# Patient Record
Sex: Male | Born: 1940 | Race: Black or African American | Marital: Married | Smoking: Former smoker
Health system: Southern US, Community
[De-identification: ages and names within clinical notes are randomized; demographics above are authoritative.]

## PROBLEM LIST (undated history)

## (undated) DIAGNOSIS — K219 Gastro-esophageal reflux disease without esophagitis: Secondary | ICD-10-CM

## (undated) DIAGNOSIS — C801 Malignant (primary) neoplasm, unspecified: Secondary | ICD-10-CM

## (undated) DIAGNOSIS — H905 Unspecified sensorineural hearing loss: Secondary | ICD-10-CM

## (undated) DIAGNOSIS — I10 Essential (primary) hypertension: Secondary | ICD-10-CM

## (undated) DIAGNOSIS — G4733 Obstructive sleep apnea (adult) (pediatric): Secondary | ICD-10-CM

## (undated) DIAGNOSIS — T8182XA Emphysema (subcutaneous) resulting from a procedure, initial encounter: Secondary | ICD-10-CM

## (undated) DIAGNOSIS — R739 Hyperglycemia, unspecified: Secondary | ICD-10-CM

## (undated) DIAGNOSIS — Z9981 Dependence on supplemental oxygen: Secondary | ICD-10-CM

## (undated) DIAGNOSIS — E785 Hyperlipidemia, unspecified: Secondary | ICD-10-CM

## (undated) DIAGNOSIS — R918 Other nonspecific abnormal finding of lung field: Secondary | ICD-10-CM

## (undated) DIAGNOSIS — J9611 Chronic respiratory failure with hypoxia: Secondary | ICD-10-CM

## (undated) DIAGNOSIS — J449 Chronic obstructive pulmonary disease, unspecified: Secondary | ICD-10-CM

## (undated) DIAGNOSIS — N289 Disorder of kidney and ureter, unspecified: Secondary | ICD-10-CM

---

## 2010-07-08 ENCOUNTER — Ambulatory Visit (HOSPITAL_COMMUNITY)
Admission: RE | Admit: 2010-07-08 | Discharge: 2010-07-08 | Disposition: A | Discharge status: Home / Self Care | Payer: Medicare Other | Source: Ambulatory Visit | Attending: Internal Medicine | Admitting: Internal Medicine

## 2010-07-08 ENCOUNTER — Other Ambulatory Visit (HOSPITAL_BASED_OUTPATIENT_CLINIC_OR_DEPARTMENT_OTHER): Payer: Self-pay | Admitting: Internal Medicine

## 2010-07-08 ENCOUNTER — Encounter (HOSPITAL_BASED_OUTPATIENT_CLINIC_OR_DEPARTMENT_OTHER): Payer: Medicare Other | Attending: Internal Medicine

## 2010-07-08 DIAGNOSIS — T148XXA Other injury of unspecified body region, initial encounter: Secondary | ICD-10-CM | POA: Insufficient documentation

## 2010-07-08 DIAGNOSIS — X58XXXA Exposure to other specified factors, initial encounter: Secondary | ICD-10-CM | POA: Insufficient documentation

## 2010-07-08 DIAGNOSIS — E7800 Pure hypercholesterolemia, unspecified: Secondary | ICD-10-CM | POA: Insufficient documentation

## 2010-07-08 DIAGNOSIS — R918 Other nonspecific abnormal finding of lung field: Secondary | ICD-10-CM | POA: Insufficient documentation

## 2010-07-08 DIAGNOSIS — F17200 Nicotine dependence, unspecified, uncomplicated: Secondary | ICD-10-CM | POA: Insufficient documentation

## 2010-07-08 DIAGNOSIS — Z96649 Presence of unspecified artificial hip joint: Secondary | ICD-10-CM | POA: Insufficient documentation

## 2010-07-08 DIAGNOSIS — Z8589 Personal history of malignant neoplasm of other organs and systems: Secondary | ICD-10-CM | POA: Insufficient documentation

## 2010-07-08 DIAGNOSIS — H409 Unspecified glaucoma: Secondary | ICD-10-CM | POA: Insufficient documentation

## 2010-07-08 DIAGNOSIS — Z79899 Other long term (current) drug therapy: Secondary | ICD-10-CM | POA: Insufficient documentation

## 2010-07-08 DIAGNOSIS — Y842 Radiological procedure and radiotherapy as the cause of abnormal reaction of the patient, or of later complication, without mention of misadventure at the time of the procedure: Secondary | ICD-10-CM | POA: Insufficient documentation

## 2010-07-08 DIAGNOSIS — I10 Essential (primary) hypertension: Secondary | ICD-10-CM | POA: Insufficient documentation

## 2010-07-08 DIAGNOSIS — M278 Other specified diseases of jaws: Secondary | ICD-10-CM | POA: Insufficient documentation

## 2010-07-09 ENCOUNTER — Other Ambulatory Visit (HOSPITAL_BASED_OUTPATIENT_CLINIC_OR_DEPARTMENT_OTHER): Payer: Self-pay | Admitting: General Surgery

## 2010-07-09 DIAGNOSIS — R911 Solitary pulmonary nodule: Secondary | ICD-10-CM

## 2010-07-12 ENCOUNTER — Encounter (HOSPITAL_COMMUNITY): Payer: Self-pay

## 2010-07-12 ENCOUNTER — Ambulatory Visit (HOSPITAL_COMMUNITY)
Admission: RE | Admit: 2010-07-12 | Discharge: 2010-07-12 | Disposition: A | Discharge status: Home / Self Care | Payer: Medicare Other | Source: Ambulatory Visit | Attending: General Surgery | Admitting: General Surgery

## 2010-07-12 DIAGNOSIS — R911 Solitary pulmonary nodule: Secondary | ICD-10-CM

## 2010-07-12 DIAGNOSIS — M4000 Postural kyphosis, site unspecified: Secondary | ICD-10-CM | POA: Insufficient documentation

## 2010-07-12 DIAGNOSIS — Z96649 Presence of unspecified artificial hip joint: Secondary | ICD-10-CM | POA: Insufficient documentation

## 2010-07-12 DIAGNOSIS — I700 Atherosclerosis of aorta: Secondary | ICD-10-CM | POA: Insufficient documentation

## 2010-07-12 DIAGNOSIS — K7689 Other specified diseases of liver: Secondary | ICD-10-CM | POA: Insufficient documentation

## 2010-07-12 DIAGNOSIS — K449 Diaphragmatic hernia without obstruction or gangrene: Secondary | ICD-10-CM | POA: Insufficient documentation

## 2010-07-12 DIAGNOSIS — I2584 Coronary atherosclerosis due to calcified coronary lesion: Secondary | ICD-10-CM | POA: Insufficient documentation

## 2010-07-12 DIAGNOSIS — R918 Other nonspecific abnormal finding of lung field: Secondary | ICD-10-CM | POA: Insufficient documentation

## 2010-07-12 MED ORDER — IOHEXOL 300 MG/ML  SOLN
100.0000 mL | Freq: Once | INTRAMUSCULAR | Status: AC | PRN
  Administered 2010-07-12: 100 mL via INTRAVENOUS

## 2010-07-15 ENCOUNTER — Encounter (HOSPITAL_BASED_OUTPATIENT_CLINIC_OR_DEPARTMENT_OTHER): Payer: Medicare Other | Attending: Internal Medicine

## 2010-07-15 DIAGNOSIS — I10 Essential (primary) hypertension: Secondary | ICD-10-CM | POA: Insufficient documentation

## 2010-07-15 DIAGNOSIS — Z79899 Other long term (current) drug therapy: Secondary | ICD-10-CM | POA: Insufficient documentation

## 2010-07-15 DIAGNOSIS — E7800 Pure hypercholesterolemia, unspecified: Secondary | ICD-10-CM | POA: Insufficient documentation

## 2010-07-15 DIAGNOSIS — Z85819 Personal history of malignant neoplasm of unspecified site of lip, oral cavity, and pharynx: Secondary | ICD-10-CM | POA: Insufficient documentation

## 2010-07-15 DIAGNOSIS — Z7982 Long term (current) use of aspirin: Secondary | ICD-10-CM | POA: Insufficient documentation

## 2010-07-15 DIAGNOSIS — K056 Periodontal disease, unspecified: Secondary | ICD-10-CM | POA: Insufficient documentation

## 2010-07-15 DIAGNOSIS — M278 Other specified diseases of jaws: Secondary | ICD-10-CM | POA: Insufficient documentation

## 2010-07-15 DIAGNOSIS — Y842 Radiological procedure and radiotherapy as the cause of abnormal reaction of the patient, or of later complication, without mention of misadventure at the time of the procedure: Secondary | ICD-10-CM | POA: Insufficient documentation

## 2010-07-15 DIAGNOSIS — H409 Unspecified glaucoma: Secondary | ICD-10-CM | POA: Insufficient documentation

## 2010-07-19 ENCOUNTER — Encounter (HOSPITAL_BASED_OUTPATIENT_CLINIC_OR_DEPARTMENT_OTHER): Payer: Medicare Other | Attending: Internal Medicine

## 2010-07-19 DIAGNOSIS — F17200 Nicotine dependence, unspecified, uncomplicated: Secondary | ICD-10-CM | POA: Insufficient documentation

## 2010-07-19 DIAGNOSIS — I10 Essential (primary) hypertension: Secondary | ICD-10-CM | POA: Insufficient documentation

## 2010-07-19 DIAGNOSIS — E7800 Pure hypercholesterolemia, unspecified: Secondary | ICD-10-CM | POA: Insufficient documentation

## 2010-07-19 DIAGNOSIS — Y842 Radiological procedure and radiotherapy as the cause of abnormal reaction of the patient, or of later complication, without mention of misadventure at the time of the procedure: Secondary | ICD-10-CM | POA: Insufficient documentation

## 2010-07-19 DIAGNOSIS — H409 Unspecified glaucoma: Secondary | ICD-10-CM | POA: Insufficient documentation

## 2010-07-19 DIAGNOSIS — M278 Other specified diseases of jaws: Secondary | ICD-10-CM | POA: Insufficient documentation

## 2010-07-19 DIAGNOSIS — Z79899 Other long term (current) drug therapy: Secondary | ICD-10-CM | POA: Insufficient documentation

## 2010-07-19 DIAGNOSIS — Z96649 Presence of unspecified artificial hip joint: Secondary | ICD-10-CM | POA: Insufficient documentation

## 2010-07-19 DIAGNOSIS — Z8589 Personal history of malignant neoplasm of other organs and systems: Secondary | ICD-10-CM | POA: Insufficient documentation

## 2010-07-21 ENCOUNTER — Other Ambulatory Visit (HOSPITAL_BASED_OUTPATIENT_CLINIC_OR_DEPARTMENT_OTHER): Payer: Self-pay | Admitting: Internal Medicine

## 2010-08-17 ENCOUNTER — Encounter (HOSPITAL_BASED_OUTPATIENT_CLINIC_OR_DEPARTMENT_OTHER): Payer: Medicare Other | Attending: Internal Medicine

## 2010-08-17 DIAGNOSIS — C080 Malignant neoplasm of submandibular gland: Secondary | ICD-10-CM | POA: Insufficient documentation

## 2010-08-17 DIAGNOSIS — Z923 Personal history of irradiation: Secondary | ICD-10-CM | POA: Insufficient documentation

## 2010-08-17 DIAGNOSIS — Z79899 Other long term (current) drug therapy: Secondary | ICD-10-CM | POA: Insufficient documentation

## 2010-08-17 DIAGNOSIS — I10 Essential (primary) hypertension: Secondary | ICD-10-CM | POA: Insufficient documentation

## 2010-08-17 DIAGNOSIS — Z96649 Presence of unspecified artificial hip joint: Secondary | ICD-10-CM | POA: Insufficient documentation

## 2010-08-17 DIAGNOSIS — Z7982 Long term (current) use of aspirin: Secondary | ICD-10-CM | POA: Insufficient documentation

## 2010-08-17 DIAGNOSIS — I998 Other disorder of circulatory system: Secondary | ICD-10-CM | POA: Insufficient documentation

## 2010-08-17 DIAGNOSIS — F17200 Nicotine dependence, unspecified, uncomplicated: Secondary | ICD-10-CM | POA: Insufficient documentation

## 2010-08-17 DIAGNOSIS — E7800 Pure hypercholesterolemia, unspecified: Secondary | ICD-10-CM | POA: Insufficient documentation

## 2010-08-17 DIAGNOSIS — K055 Other periodontal diseases: Secondary | ICD-10-CM | POA: Insufficient documentation

## 2010-08-17 DIAGNOSIS — M278 Other specified diseases of jaws: Secondary | ICD-10-CM | POA: Insufficient documentation

## 2010-08-18 ENCOUNTER — Other Ambulatory Visit (HOSPITAL_BASED_OUTPATIENT_CLINIC_OR_DEPARTMENT_OTHER): Payer: Self-pay | Admitting: Internal Medicine

## 2010-08-18 LAB — GLUCOSE, CAPILLARY: Glucose-Capillary: 118 mg/dL — ABNORMAL HIGH (ref 70–99)

## 2010-09-17 ENCOUNTER — Encounter (HOSPITAL_BASED_OUTPATIENT_CLINIC_OR_DEPARTMENT_OTHER): Payer: Medicare Other | Attending: Internal Medicine

## 2010-09-17 DIAGNOSIS — C080 Malignant neoplasm of submandibular gland: Secondary | ICD-10-CM | POA: Insufficient documentation

## 2010-09-17 DIAGNOSIS — F17200 Nicotine dependence, unspecified, uncomplicated: Secondary | ICD-10-CM | POA: Insufficient documentation

## 2010-09-17 DIAGNOSIS — Z7982 Long term (current) use of aspirin: Secondary | ICD-10-CM | POA: Insufficient documentation

## 2010-09-17 DIAGNOSIS — Z96649 Presence of unspecified artificial hip joint: Secondary | ICD-10-CM | POA: Insufficient documentation

## 2010-09-17 DIAGNOSIS — I10 Essential (primary) hypertension: Secondary | ICD-10-CM | POA: Insufficient documentation

## 2010-09-17 DIAGNOSIS — Z923 Personal history of irradiation: Secondary | ICD-10-CM | POA: Insufficient documentation

## 2010-09-17 DIAGNOSIS — Z79899 Other long term (current) drug therapy: Secondary | ICD-10-CM | POA: Insufficient documentation

## 2010-09-17 DIAGNOSIS — K055 Other periodontal diseases: Secondary | ICD-10-CM | POA: Insufficient documentation

## 2010-09-17 DIAGNOSIS — E7800 Pure hypercholesterolemia, unspecified: Secondary | ICD-10-CM | POA: Insufficient documentation

## 2010-09-17 DIAGNOSIS — I998 Other disorder of circulatory system: Secondary | ICD-10-CM | POA: Insufficient documentation

## 2010-09-17 DIAGNOSIS — M278 Other specified diseases of jaws: Secondary | ICD-10-CM | POA: Insufficient documentation

## 2019-03-21 ENCOUNTER — Emergency Department (HOSPITAL_COMMUNITY): Payer: Medicare HMO

## 2019-03-21 ENCOUNTER — Inpatient Hospital Stay (HOSPITAL_COMMUNITY)
Admission: EM | Admit: 2019-03-21 | Discharge: 2019-03-29 | DRG: 040 | Disposition: A | Discharge status: Skilled Nursing Facility | Payer: Medicare HMO | Attending: Internal Medicine | Admitting: Internal Medicine

## 2019-03-21 ENCOUNTER — Inpatient Hospital Stay (HOSPITAL_COMMUNITY): Payer: Medicare HMO

## 2019-03-21 ENCOUNTER — Encounter (HOSPITAL_COMMUNITY): Payer: Self-pay | Admitting: Neurology

## 2019-03-21 ENCOUNTER — Other Ambulatory Visit: Payer: Self-pay

## 2019-03-21 DIAGNOSIS — W19XXXA Unspecified fall, initial encounter: Secondary | ICD-10-CM | POA: Diagnosis present

## 2019-03-21 DIAGNOSIS — Z85830 Personal history of malignant neoplasm of bone: Secondary | ICD-10-CM

## 2019-03-21 DIAGNOSIS — R1310 Dysphagia, unspecified: Secondary | ICD-10-CM | POA: Diagnosis present

## 2019-03-21 DIAGNOSIS — Z9981 Dependence on supplemental oxygen: Secondary | ICD-10-CM

## 2019-03-21 DIAGNOSIS — K219 Gastro-esophageal reflux disease without esophagitis: Secondary | ICD-10-CM | POA: Diagnosis present

## 2019-03-21 DIAGNOSIS — E875 Hyperkalemia: Secondary | ICD-10-CM | POA: Diagnosis present

## 2019-03-21 DIAGNOSIS — R296 Repeated falls: Secondary | ICD-10-CM | POA: Diagnosis present

## 2019-03-21 DIAGNOSIS — E1151 Type 2 diabetes mellitus with diabetic peripheral angiopathy without gangrene: Secondary | ICD-10-CM | POA: Diagnosis present

## 2019-03-21 DIAGNOSIS — J9622 Acute and chronic respiratory failure with hypercapnia: Secondary | ICD-10-CM | POA: Diagnosis present

## 2019-03-21 DIAGNOSIS — G4733 Obstructive sleep apnea (adult) (pediatric): Secondary | ICD-10-CM | POA: Diagnosis present

## 2019-03-21 DIAGNOSIS — H905 Unspecified sensorineural hearing loss: Secondary | ICD-10-CM | POA: Diagnosis present

## 2019-03-21 DIAGNOSIS — G9341 Metabolic encephalopathy: Secondary | ICD-10-CM | POA: Diagnosis not present

## 2019-03-21 DIAGNOSIS — I639 Cerebral infarction, unspecified: Secondary | ICD-10-CM | POA: Diagnosis not present

## 2019-03-21 DIAGNOSIS — I361 Nonrheumatic tricuspid (valve) insufficiency: Secondary | ICD-10-CM | POA: Diagnosis not present

## 2019-03-21 DIAGNOSIS — J9621 Acute and chronic respiratory failure with hypoxia: Secondary | ICD-10-CM | POA: Diagnosis present

## 2019-03-21 DIAGNOSIS — Z9119 Patient's noncompliance with other medical treatment and regimen: Secondary | ICD-10-CM

## 2019-03-21 DIAGNOSIS — J449 Chronic obstructive pulmonary disease, unspecified: Secondary | ICD-10-CM | POA: Diagnosis present

## 2019-03-21 DIAGNOSIS — G9340 Encephalopathy, unspecified: Secondary | ICD-10-CM

## 2019-03-21 DIAGNOSIS — R29718 NIHSS score 18: Secondary | ICD-10-CM | POA: Diagnosis present

## 2019-03-21 DIAGNOSIS — R6510 Systemic inflammatory response syndrome (SIRS) of non-infectious origin without acute organ dysfunction: Secondary | ICD-10-CM | POA: Diagnosis present

## 2019-03-21 DIAGNOSIS — E872 Acidosis: Secondary | ICD-10-CM | POA: Diagnosis present

## 2019-03-21 DIAGNOSIS — Z7982 Long term (current) use of aspirin: Secondary | ICD-10-CM | POA: Diagnosis not present

## 2019-03-21 DIAGNOSIS — I6340 Cerebral infarction due to embolism of unspecified cerebral artery: Secondary | ICD-10-CM | POA: Diagnosis present

## 2019-03-21 DIAGNOSIS — M109 Gout, unspecified: Secondary | ICD-10-CM | POA: Diagnosis present

## 2019-03-21 DIAGNOSIS — R7401 Elevation of levels of liver transaminase levels: Secondary | ICD-10-CM

## 2019-03-21 DIAGNOSIS — Z6830 Body mass index (BMI) 30.0-30.9, adult: Secondary | ICD-10-CM

## 2019-03-21 DIAGNOSIS — Z20828 Contact with and (suspected) exposure to other viral communicable diseases: Secondary | ICD-10-CM | POA: Diagnosis present

## 2019-03-21 DIAGNOSIS — G92 Toxic encephalopathy: Secondary | ICD-10-CM

## 2019-03-21 DIAGNOSIS — R4701 Aphasia: Secondary | ICD-10-CM | POA: Diagnosis present

## 2019-03-21 DIAGNOSIS — R531 Weakness: Secondary | ICD-10-CM

## 2019-03-21 DIAGNOSIS — E669 Obesity, unspecified: Secondary | ICD-10-CM | POA: Diagnosis present

## 2019-03-21 DIAGNOSIS — I10 Essential (primary) hypertension: Secondary | ICD-10-CM | POA: Diagnosis present

## 2019-03-21 DIAGNOSIS — E785 Hyperlipidemia, unspecified: Secondary | ICD-10-CM | POA: Diagnosis present

## 2019-03-21 DIAGNOSIS — Z79899 Other long term (current) drug therapy: Secondary | ICD-10-CM | POA: Diagnosis not present

## 2019-03-21 DIAGNOSIS — H4020X0 Unspecified primary angle-closure glaucoma, stage unspecified: Secondary | ICD-10-CM | POA: Diagnosis present

## 2019-03-21 DIAGNOSIS — Z87891 Personal history of nicotine dependence: Secondary | ICD-10-CM | POA: Diagnosis not present

## 2019-03-21 DIAGNOSIS — Z23 Encounter for immunization: Secondary | ICD-10-CM

## 2019-03-21 DIAGNOSIS — I340 Nonrheumatic mitral (valve) insufficiency: Secondary | ICD-10-CM | POA: Diagnosis not present

## 2019-03-21 DIAGNOSIS — Z923 Personal history of irradiation: Secondary | ICD-10-CM

## 2019-03-21 DIAGNOSIS — N179 Acute kidney failure, unspecified: Secondary | ICD-10-CM | POA: Diagnosis present

## 2019-03-21 HISTORY — DX: Gastro-esophageal reflux disease without esophagitis: K21.9

## 2019-03-21 HISTORY — DX: Dependence on supplemental oxygen: Z99.81

## 2019-03-21 HISTORY — DX: Obstructive sleep apnea (adult) (pediatric): G47.33

## 2019-03-21 HISTORY — DX: Dependence on supplemental oxygen: J96.11

## 2019-03-21 HISTORY — DX: Other nonspecific abnormal finding of lung field: R91.8

## 2019-03-21 HISTORY — DX: Disorder of kidney and ureter, unspecified: N28.9

## 2019-03-21 HISTORY — DX: Emphysema (subcutaneous) resulting from a procedure, initial encounter: T81.82XA

## 2019-03-21 HISTORY — DX: Hyperglycemia, unspecified: R73.9

## 2019-03-21 HISTORY — DX: Unspecified sensorineural hearing loss: H90.5

## 2019-03-21 HISTORY — DX: Essential (primary) hypertension: I10

## 2019-03-21 HISTORY — DX: Malignant (primary) neoplasm, unspecified: C80.1

## 2019-03-21 HISTORY — DX: Hyperlipidemia, unspecified: E78.5

## 2019-03-21 HISTORY — DX: Chronic obstructive pulmonary disease, unspecified: J44.9

## 2019-03-21 LAB — COMPREHENSIVE METABOLIC PANEL
ALT: 457 U/L — ABNORMAL HIGH (ref 0–44)
AST: 490 U/L — ABNORMAL HIGH (ref 15–41)
Albumin: 3.2 g/dL — ABNORMAL LOW (ref 3.5–5.0)
Alkaline Phosphatase: 92 U/L (ref 38–126)
Anion gap: 14 (ref 5–15)
BUN: 27 mg/dL — ABNORMAL HIGH (ref 8–23)
CO2: 31 mmol/L (ref 22–32)
Calcium: 8.9 mg/dL (ref 8.9–10.3)
Chloride: 94 mmol/L — ABNORMAL LOW (ref 98–111)
Creatinine, Ser: 2.4 mg/dL — ABNORMAL HIGH (ref 0.61–1.24)
GFR calc Af Amer: 29 mL/min — ABNORMAL LOW (ref >60)
GFR calc non Af Amer: 25 mL/min — ABNORMAL LOW (ref >60)
Glucose, Bld: 156 mg/dL — ABNORMAL HIGH (ref 70–99)
Potassium: 6.4 mmol/L (ref 3.5–5.1)
Sodium: 139 mmol/L (ref 135–145)
Total Bilirubin: 1.1 mg/dL (ref 0.3–1.2)
Total Protein: 8.2 g/dL — ABNORMAL HIGH (ref 6.5–8.1)

## 2019-03-21 LAB — URINALYSIS, ROUTINE W REFLEX MICROSCOPIC
Bacteria, UA: NONE SEEN
Bilirubin Urine: NEGATIVE
Glucose, UA: 50 mg/dL — AB
Ketones, ur: NEGATIVE mg/dL
Leukocytes,Ua: NEGATIVE
Nitrite: NEGATIVE
Protein, ur: 30 mg/dL — AB
Specific Gravity, Urine: >1.046 — ABNORMAL HIGH (ref 1.005–1.030)
pH: 5 (ref 5.0–8.0)

## 2019-03-21 LAB — CBC
HCT: 42.8 % (ref 39.0–52.0)
Hemoglobin: 12.7 g/dL — ABNORMAL LOW (ref 13.0–17.0)
MCH: 27.1 pg (ref 26.0–34.0)
MCHC: 29.7 g/dL — ABNORMAL LOW (ref 30.0–36.0)
MCV: 91.5 fL (ref 80.0–100.0)
Platelets: 219 10*3/uL (ref 150–400)
RBC: 4.68 MIL/uL (ref 4.22–5.81)
RDW: 15.7 % — ABNORMAL HIGH (ref 11.5–15.5)
WBC: 12.5 10*3/uL — ABNORMAL HIGH (ref 4.0–10.5)
nRBC: 0.3 % — ABNORMAL HIGH (ref 0.0–0.2)

## 2019-03-21 LAB — RAPID URINE DRUG SCREEN, HOSP PERFORMED
Amphetamines: NOT DETECTED
Barbiturates: NOT DETECTED
Benzodiazepines: NOT DETECTED
Cocaine: NOT DETECTED
Opiates: POSITIVE — AB
Tetrahydrocannabinol: NOT DETECTED

## 2019-03-21 LAB — POCT I-STAT 7, (LYTES, BLD GAS, ICA,H+H)
Acid-Base Excess: 7 mmol/L — ABNORMAL HIGH (ref 0.0–2.0)
Acid-Base Excess: 11 mmol/L — ABNORMAL HIGH (ref 0.0–2.0)
Bicarbonate: 36.1 mmol/L — ABNORMAL HIGH (ref 20.0–28.0)
Bicarbonate: 39.5 mmol/L — ABNORMAL HIGH (ref 20.0–28.0)
Calcium, Ion: 1.15 mmol/L (ref 1.15–1.40)
Calcium, Ion: 1.16 mmol/L (ref 1.15–1.40)
HCT: 37 % — ABNORMAL LOW (ref 39.0–52.0)
HCT: 38 % — ABNORMAL LOW (ref 39.0–52.0)
Hemoglobin: 12.6 g/dL — ABNORMAL LOW (ref 13.0–17.0)
Hemoglobin: 12.9 g/dL — ABNORMAL LOW (ref 13.0–17.0)
O2 Saturation: 88 %
O2 Saturation: 98 %
Patient temperature: 98
Patient temperature: 98
Potassium: 5.4 mmol/L — ABNORMAL HIGH (ref 3.5–5.1)
Potassium: 4.7 mmol/L (ref 3.5–5.1)
Sodium: 136 mmol/L (ref 135–145)
Sodium: 139 mmol/L (ref 135–145)
TCO2: 38 mmol/L — ABNORMAL HIGH (ref 22–32)
TCO2: 42 mmol/L — ABNORMAL HIGH (ref 22–32)
pCO2 arterial: 70.5 mmHg (ref 32.0–48.0)
pCO2 arterial: 70.9 mmHg (ref 32.0–48.0)
pH, Arterial: 7.315 — ABNORMAL LOW (ref 7.350–7.450)
pH, Arterial: 7.353 (ref 7.350–7.450)
pO2, Arterial: 61 mmHg — ABNORMAL LOW (ref 83.0–108.0)
pO2, Arterial: 110 mmHg — ABNORMAL HIGH (ref 83.0–108.0)

## 2019-03-21 LAB — CBG MONITORING, ED
Glucose-Capillary: 119 mg/dL — ABNORMAL HIGH (ref 70–99)
Glucose-Capillary: 154 mg/dL — ABNORMAL HIGH (ref 70–99)

## 2019-03-21 LAB — SALICYLATE LEVEL: Salicylate Lvl: <7 mg/dL (ref 2.8–30.0)

## 2019-03-21 LAB — I-STAT CHEM 8, ED
BUN: 38 mg/dL — ABNORMAL HIGH (ref 8–23)
Calcium, Ion: 0.97 mmol/L — ABNORMAL LOW (ref 1.15–1.40)
Chloride: 97 mmol/L — ABNORMAL LOW (ref 98–111)
Creatinine, Ser: 2.3 mg/dL — ABNORMAL HIGH (ref 0.61–1.24)
Glucose, Bld: 153 mg/dL — ABNORMAL HIGH (ref 70–99)
HCT: 43 % (ref 39.0–52.0)
Hemoglobin: 14.6 g/dL (ref 13.0–17.0)
Potassium: 6.4 mmol/L (ref 3.5–5.1)
Sodium: 136 mmol/L (ref 135–145)
TCO2: 35 mmol/L — ABNORMAL HIGH (ref 22–32)

## 2019-03-21 LAB — ACETAMINOPHEN LEVEL: Acetaminophen (Tylenol), Serum: <10 ug/mL — ABNORMAL LOW (ref 10–30)

## 2019-03-21 LAB — LACTIC ACID, PLASMA
Lactic Acid, Venous: 2.5 mmol/L (ref 0.5–1.9)
Lactic Acid, Venous: 1.5 mmol/L (ref 0.5–1.9)

## 2019-03-21 LAB — DIFFERENTIAL
Abs Immature Granulocytes: 0.15 10*3/uL — ABNORMAL HIGH (ref 0.00–0.07)
Basophils Absolute: 0 10*3/uL (ref 0.0–0.1)
Basophils Relative: 0 %
Eosinophils Absolute: 0 10*3/uL (ref 0.0–0.5)
Eosinophils Relative: 0 %
Immature Granulocytes: 1 %
Lymphocytes Relative: 4 %
Lymphs Abs: 0.5 10*3/uL — ABNORMAL LOW (ref 0.7–4.0)
Monocytes Absolute: 0.8 10*3/uL (ref 0.1–1.0)
Monocytes Relative: 6 %
Neutro Abs: 11.1 10*3/uL — ABNORMAL HIGH (ref 1.7–7.7)
Neutrophils Relative %: 89 %

## 2019-03-21 LAB — POTASSIUM: Potassium: 5.9 mmol/L — ABNORMAL HIGH (ref 3.5–5.1)

## 2019-03-21 LAB — TSH: TSH: 3.94 u[IU]/mL (ref 0.350–4.500)

## 2019-03-21 LAB — PROTIME-INR
INR: 1.3 — ABNORMAL HIGH (ref 0.8–1.2)
Prothrombin Time: 16.1 seconds — ABNORMAL HIGH (ref 11.4–15.2)

## 2019-03-21 LAB — AMMONIA: Ammonia: 23 umol/L (ref 9–35)

## 2019-03-21 LAB — CK: Total CK: 81 U/L (ref 49–397)

## 2019-03-21 LAB — APTT: aPTT: 23 seconds — ABNORMAL LOW (ref 24–36)

## 2019-03-21 LAB — VITAMIN B12: Vitamin B-12: 508 pg/mL (ref 180–914)

## 2019-03-21 LAB — SARS CORONAVIRUS 2 BY RT PCR (HOSPITAL ORDER, PERFORMED IN ~~LOC~~ HOSPITAL LAB): SARS Coronavirus 2: NEGATIVE

## 2019-03-21 LAB — POC SARS CORONAVIRUS 2 AG -  ED: SARS Coronavirus 2 Ag: NEGATIVE

## 2019-03-21 MED ORDER — Medication
30.00 | Status: DC
Start: 2019-03-20 — End: 2019-03-21

## 2019-03-21 MED ORDER — CALCIUM-VITAMIN D
600.00 | Status: DC
Start: 2019-03-19 — End: 2019-03-21

## 2019-03-21 MED ORDER — DEXTROSE 50 % IV SOLN
1.0000 | Freq: Once | INTRAVENOUS | Status: AC
  Administered 2019-03-21: 50 mL via INTRAVENOUS
  Filled 2019-03-21: qty 50

## 2019-03-21 MED ORDER — DOCUSATE SODIUM 100 MG PO CAPS
100.00 | ORAL_CAPSULE | ORAL | Status: DC
Start: ? — End: 2019-03-21

## 2019-03-21 MED ORDER — RAPAMUNE 1 MG/ML PO SOLN
100.00 | ORAL | Status: DC
Start: 2019-03-19 — End: 2019-03-21

## 2019-03-21 MED ORDER — QUINERVA 260 MG PO TABS
650.00 | ORAL_TABLET | ORAL | Status: DC
Start: ? — End: 2019-03-21

## 2019-03-21 MED ORDER — DIHYDROTACHYSTEROL 0.125 MG PO TABS
10.00 | ORAL_TABLET | ORAL | Status: DC
Start: 2019-03-20 — End: 2019-03-21

## 2019-03-21 MED ORDER — ONDANSETRON HCL 4 MG PO TABS: 4.0000 mg | ORAL_TABLET | Freq: Four times a day (QID) | ORAL | Status: DC | PRN

## 2019-03-21 MED ORDER — HEPARIN SODIUM (PORCINE) 5000 UNIT/ML IJ SOLN
5000.0000 [IU] | Freq: Three times a day (TID) | INTRAMUSCULAR | Status: DC
  Administered 2019-03-21 – 2019-03-29 (×22): 5000 [IU] via SUBCUTANEOUS
  Filled 2019-03-21 (×22): qty 1

## 2019-03-21 MED ORDER — METHYLPREDNISOLONE SODIUM SUCC IJ
1.00 | INTRAMUSCULAR | Status: DC
Start: 2019-03-19 — End: 2019-03-21

## 2019-03-21 MED ORDER — IOHEXOL 350 MG/ML SOLN
100.0000 mL | Freq: Once | INTRAVENOUS | Status: AC | PRN
  Administered 2019-03-21: 100 mL via INTRAVENOUS

## 2019-03-21 MED ORDER — GENERIC EXTERNAL MEDICATION
Status: DC
Start: ? — End: 2019-03-21

## 2019-03-21 MED ORDER — Medication
1.00 | Status: DC
Start: 2019-03-19 — End: 2019-03-21

## 2019-03-21 MED ORDER — SODIUM BICARBONATE 8.4 % IV SOLN
50.0000 meq | Freq: Once | INTRAVENOUS | Status: AC
  Administered 2019-03-21: 50 meq via INTRAVENOUS
  Filled 2019-03-21: qty 50

## 2019-03-21 MED ORDER — DURACLON EP
1000.00 | EPIDURAL | Status: DC
Start: 2019-03-20 — End: 2019-03-21

## 2019-03-21 MED ORDER — ONDANSETRON HCL 4 MG/2ML IJ SOLN
4.0000 mg | Freq: Four times a day (QID) | INTRAMUSCULAR | Status: DC | PRN
  Administered 2019-03-24 – 2019-03-25 (×2): 4 mg via INTRAVENOUS
  Filled 2019-03-21 (×2): qty 2

## 2019-03-21 MED ORDER — INSULIN ASPART 100 UNIT/ML IV SOLN
5.0000 [IU] | Freq: Once | INTRAVENOUS | Status: AC
  Administered 2019-03-21: 5 [IU] via INTRAVENOUS

## 2019-03-21 MED ORDER — IPRATROPIUM-ALBUTEROL 0.5-2.5 (3) MG/3ML IN SOLN
3.0000 mL | Freq: Four times a day (QID) | RESPIRATORY_TRACT | Status: DC
  Administered 2019-03-21 – 2019-03-23 (×6): 3 mL via RESPIRATORY_TRACT
  Filled 2019-03-21 (×6): qty 3

## 2019-03-21 MED ORDER — DOBUTAMINE IN D5W IV
300.00 | INTRAVENOUS | Status: DC
Start: 2019-03-20 — End: 2019-03-21

## 2019-03-21 MED ORDER — SODIUM CHLORIDE 0.9% FLUSH
3.0000 mL | Freq: Two times a day (BID) | INTRAVENOUS | Status: DC
  Administered 2019-03-22 – 2019-03-28 (×11): 3 mL via INTRAVENOUS

## 2019-03-21 MED ORDER — SODIUM CHLORIDE 0.9 % IV BOLUS
500.0000 mL | Freq: Once | INTRAVENOUS | Status: AC
  Administered 2019-03-21: 500 mL via INTRAVENOUS

## 2019-03-21 MED ORDER — BAYER WOMENS 81-300 MG PO TABS
40.00 | ORAL_TABLET | ORAL | Status: DC
Start: 2019-03-20 — End: 2019-03-21

## 2019-03-21 MED ORDER — PHENYLEPHRINE-GUAIFENESIN 30-400 MG PO CP12
10.00 | ORAL_CAPSULE | ORAL | Status: DC
Start: 2019-03-20 — End: 2019-03-21

## 2019-03-21 MED ORDER — PHENYLEPH-POT GUAIACOLSULF
81.00 | Status: DC
Start: 2019-03-20 — End: 2019-03-21

## 2019-03-21 MED ORDER — DIOTAME PO
25.00 | ORAL | Status: DC
Start: 2019-03-20 — End: 2019-03-21

## 2019-03-21 MED ORDER — SODIUM CHLORIDE 0.9 % IV SOLN
100.0000 mL/h | INTRAVENOUS | Status: DC
  Administered 2019-03-21 – 2019-03-22 (×2): 100 mL/h via INTRAVENOUS

## 2019-03-21 MED ORDER — SOTRADECOL 1 % IV SOLN
325.00 | INTRAVENOUS | Status: DC
Start: 2019-03-20 — End: 2019-03-21

## 2019-03-21 NOTE — Procedures (Signed)
Patient Name: Phillip Owens  MRN: [id number]  Epilepsy Attending: Lora Havens  Referring Physician/Provider: Dr. Fuller Plan Date: 03/21/2019 Duration: 23 minutes  Patient history: 78 year old male with altered mental status.  EEG to evaluate for seizures.  Level of alertness: Awake  AEDs during EEG study: None  Technical aspects: This EEG study was done with scalp electrodes positioned according to the 10-20 International system of electrode placement. Electrical activity was acquired at a sampling rate of 500Hz  and reviewed with a high frequency filter of 70Hz  and a low frequency filter of 1Hz . EEG data were recorded continuously and digitally stored.   Description: During awake state, no clear posterior dominant rhythm was seen.  EEG showed continuous generalized 3 to 6 Hz theta-delta slowing.  Hyperventilation and photic stimulation were not performed.  Abnormality -Continued slow, generalized  IMPRESSION: This study is suggestive of moderate diffuse encephalopathy, nonspecific to etiology. No seizures or epileptiform discharges were seen throughout the recording.  Phillip Owens Phillip Owens

## 2019-03-21 NOTE — ED Notes (Signed)
RT requested for transport.

## 2019-03-21 NOTE — ED Triage Notes (Addendum)
Pt family arrives, reports that pt tremors typically, does not see well out of L eye. Adds that pt was off his 4L O2 at home for an unknown amount of time last night.

## 2019-03-21 NOTE — Progress Notes (Signed)
MRI called and says pt needs an abdominal xray/kub before patient goes down for scheduled scan since he is A & Ox1. Provider notified.

## 2019-03-21 NOTE — H&P (Addendum)
History and Physical    Phillip Owens [id number] DOB: 06-01-1940 DOA: 03/21/2019  Referring MD/NP/PA: Carmin Muskrat, MD PCP: Patient, No Pcp Per  Patient coming from: Home via EMS  Chief Complaint: Altered  I have personally briefly reviewed patient's old medical records in [place]   HPI: Phillip Owens is a 78 y.o. male with medical history significant of HTN, HLD, COPD, oxygen dependent on 4 L, gout, and OSA on CPAP.  He presents after being found acutely altered by his wife this morning.  Patient was last noted to be normal sometime last night 11:30 PM.  History is obtained from the wife as patient is altered and it is difficult to comprehend what words he is saying. Patient had just recently been hospitalized at [facility] on 11/29-12/1, for acute respiratory failure with hypoxia and hypercapnia and some febrile illness.  During his hospital stay he was seen to be hypercapnic and was placed on BiPAP with improvement in mental status.  He also was noted to have temperature up to 102.2 F and a mild nonproductive cough, but was negative for COVID-19 at that time.  When he was discharged home wife notes that he needed rehab and had recently fallen 2 days ago.  Apparently, when she found him this morning around 1:30 AM and he was not wearing his CPAP mask and his oxygen was off of him.  She placed his oxygen back on him, but he was not responding appropriately to her at that time.  Later on this morning the patient reportedly just kept on moaning and family soon thereafter called EMS.  She reports that she tried to tell him multiple times that he has to wear his CPAP mask at night but he still has not been compliant.  En route with EMS patient was noted to have aphasia with right eye deviation and was acutely altered.   ED Course: Patient initially presented as code stroke.  The TPA window.  The code was canceled after evaluated by neurology with negative CT  of the brain and CTA of the head neck.  Be afebrile, pulse 85-1 07, respirations 14-23, blood pressures maintained, and O2 saturation maintained on 3 L of nasal cannula oxygen.  WBC 12.5, hemoglobin 12.6, potassium 5.4, chloride 97, BUN 38, and creatinine 2.3.  Point-of-care COVID-19 screening was negative.  ABG revealed pH 7.315, PCO2 70.5, PO2 61.  UDS positive for opiates.  Chest x-ray was otherwise clear.  Patient was given 500 mL of normal saline IV fluids, amp of sodium bicarb, 5 units of insulin, and amp of dextrose.  Review of Systems  Unable to perform ROS: Mental status change    Past Medical History:  Diagnosis Date   Cancer ([place])    Chronic respiratory failure with hypoxia, on home O2 therapy (HCC)    COPD (chronic obstructive pulmonary disease) (HCC)    Emphysema (subcutaneous) (surgical) resulting from a procedure    Essential hypertension    GERD (gastroesophageal reflux disease)    Hyperglycemia    Hyperlipidemia    Multiple lung nodules on CT    Obstructive sleep apnea on CPAP    Oxygen dependent    Renal insufficiency bilateral    Sensorineural hearing loss       reports that he has quit smoking. He uses smokeless tobacco. He reports previous alcohol use. He reports that he does not use drugs.  No Known Allergies  Family History  Problem Relation Age of Onset  Hypertension Mother    Hypertension Father     Prior to Admission medications   Not on File    Physical Exam:  Constitutional: Elderly male who appears to be altered and babbling Vitals:   03/21/19 1505 03/21/19 1515 03/21/19 1700 03/21/19 1739  BP:   (!) 171/90 (!) 166/82  Pulse: 93 86  86  Resp: (!) 24 15  14   Temp:      TempSrc:      SpO2: 96% 98%  97%   Eyes: PERRL, lids and conjunctivae normal ENMT: Mucous membranes are dry. Posterior pharynx clear of any exudate or lesions.  Neck: normal, supple, no masses, no thyromegaly Respiratory: clear to auscultation bilaterally,  no wheezing, no crackles. Normal respiratory effort. No accessory muscle use.  Cardiovascular: Regular rate and rhythm, no murmurs / rubs / gallops. No extremity edema. 2+ pedal pulses. No carotid bruits.  Abdomen: no tenderness, no masses palpated. No hepatosplenomegaly. Bowel sounds positive.  Musculoskeletal: no clubbing / cyanosis. No joint deformity upper and lower extremities. Good ROM, no contractures. Normal muscle tone.  Skin: no rashes, lesions, ulcers. No induration Neurologic: CN 2-12 grossly intact. Sensation intact, DTR normal. Strength 5/5 in all 4.  Psychiatric: Normal judgment and insight. Alert and oriented x 3. Normal mood.     Labs on Admission: I have personally reviewed following labs and imaging studies  CBC: Recent Labs  Lab 03/21/19 1215 03/21/19 1216 03/21/19 1310 03/21/19 1758  WBC 12.5*  --   --   --   NEUTROABS 11.1*  --   --   --   HGB 12.7* 14.6 12.6* 12.9*  HCT 42.8 43.0 37.0* 38.0*  MCV 91.5  --   --   --   PLT 219  --   --   --    Basic Metabolic Panel: Recent Labs  Lab 03/21/19 1215 03/21/19 1216 03/21/19 1310 03/21/19 1346 03/21/19 1758  NA 139 136 136  --  139  K 6.4* 6.4* 5.4* 5.9* 4.7  CL 94* 97*  --   --   --   CO2 31  --   --   --   --   GLUCOSE 156* 153*  --   --   --   BUN 27* 38*  --   --   --   CREATININE 2.40* 2.30*  --   --   --   CALCIUM 8.9  --   --   --   --    GFR: CrCl cannot be calculated (Unknown ideal weight.). Liver Function Tests: Recent Labs  Lab 03/21/19 1215  AST 490*  ALT 457*  ALKPHOS 92  BILITOT 1.1  PROT 8.2*  ALBUMIN 3.2*   No results for input(s): LIPASE, AMYLASE in the last 168 hours. Recent Labs  Lab 03/21/19 1346  AMMONIA 23   Coagulation Profile: Recent Labs  Lab 03/21/19 1215  INR 1.3*   Cardiac Enzymes: No results for input(s): CKTOTAL, CKMB, CKMBINDEX, TROPONINI in the last 168 hours. BNP (last 3 results) No results for input(s): PROBNP in the last 8760 hours. HbA1C: No  results for input(s): HGBA1C in the last 72 hours. CBG: Recent Labs  Lab 03/21/19 1145 03/21/19 1700  GLUCAP 119* 154*   Lipid Profile: No results for input(s): CHOL, HDL, LDLCALC, TRIG, CHOLHDL, LDLDIRECT in the last 72 hours. Thyroid Function Tests: Recent Labs    03/21/19 1346  TSH 3.940   Anemia Panel: Recent Labs    03/21/19 1346  VITAMINB12  508   Urine analysis:    Component Value Date/Time   COLORURINE YELLOW 03/21/2019 1703   APPEARANCEUR HAZY (A) 03/21/2019 1703   LABSPEC >1.046 (H) 03/21/2019 1703   PHURINE 5.0 03/21/2019 1703   GLUCOSEU 50 (A) 03/21/2019 1703   HGBUR SMALL (A) 03/21/2019 1703   BILIRUBINUR NEGATIVE 03/21/2019 1703   KETONESUR NEGATIVE 03/21/2019 1703   PROTEINUR 30 (A) 03/21/2019 1703   NITRITE NEGATIVE 03/21/2019 1703   LEUKOCYTESUR NEGATIVE 03/21/2019 1703   Sepsis Labs: Recent Results (from the past 240 hour(s))  SARS Coronavirus 2 by RT PCR (hospital order, performed in [facility] hospital lab) Nasopharyngeal Nasopharyngeal Swab     Status: None   Collection Time: 03/21/19  3:24 PM   Specimen: Nasopharyngeal Swab  Result Value Ref Range Status   SARS Coronavirus 2 NEGATIVE NEGATIVE Final    Comment: (NOTE) SARS-CoV-2 target nucleic acids are NOT DETECTED. The SARS-CoV-2 RNA is generally detectable in upper and lower respiratory specimens during the acute phase of infection. The lowest concentration of SARS-CoV-2 viral copies this assay can detect is 250 copies / mL. A negative result does not preclude SARS-CoV-2 infection and should not be used as the sole basis for treatment or other patient management decisions.  A negative result may occur with improper specimen collection / handling, submission of specimen other than nasopharyngeal swab, presence of viral mutation(s) within the areas targeted by this assay, and inadequate number of viral copies (<250 copies / mL). A negative result must be combined with  clinical observations, patient history, and epidemiological information. Fact Sheet for Patients:   StrictlyIdeas.no Fact Sheet for Healthcare Providers: BankingDealers.co.za This test is not yet approved or cleared  by the [place] FDA and has been authorized for detection and/or diagnosis of SARS-CoV-2 by FDA under an Emergency Use Authorization (EUA).  This EUA will remain in effect (meaning this test can be used) for the duration of the COVID-19 declaration under Section 564(b)(1) of the Act, 21 U.S.C. section 360bbb-3(b)(1), unless the authorization is terminated or revoked sooner. Performed at [facility], [place] [address], [place], [place] [address]      Radiological Exams on Admission: Ct Code Stroke Cta Head W/wo Contrast  Result Date: 03/21/2019 CLINICAL DATA:  Altered mental status.  Aphasia. EXAM: CT ANGIOGRAPHY HEAD AND NECK CT PERFUSION BRAIN TECHNIQUE: Multidetector CT imaging of the head and neck was performed using the standard protocol during bolus administration of intravenous contrast. Multiplanar CT image reconstructions and MIPs were obtained to evaluate the vascular anatomy. Carotid stenosis measurements (when applicable) are obtained utilizing NASCET criteria, using the distal internal carotid diameter as the denominator. Multiphase CT imaging of the brain was performed following IV bolus contrast injection. Subsequent parametric perfusion maps were calculated using RAPID software. CONTRAST:  163mL OMNIPAQUE IOHEXOL 350 MG/ML SOLN COMPARISON:  Head CT earlier same day FINDINGS: CTA NECK FINDINGS Aortic arch: Aortic atherosclerosis. No aneurysm or dissection. Branching pattern is normal without origin stenosis. Right carotid system: Common carotid artery widely patent to the bifurcation. Calcified plaque at the carotid bifurcation and ICA bulb. No stenosis when compared to the more distal cervical ICA diameter. Some  plaque affecting the distal cervical ICA but no stenosis. Left carotid system: Common carotid artery widely patent to the bifurcation. Calcified plaque at the carotid bifurcation and ICA bulb but no stenosis. Distal cervical ICA shows some plaque but no stenosis. Vertebral arteries: Both vertebral artery origins are widely patent. Both vertebral arteries appear approximately equal  in size an widely patent through the cervical region to the foramen magnum. Skeleton: Minimal cervical spondylosis. Other neck: No mass or lymphadenopathy. Upper chest: Emphysema and pulmonary scarring.  No focal lesion. Review of the MIP images confirms the above findings CTA HEAD FINDINGS Anterior circulation: Both internal carotid arteries are patent through the skull base and siphon regions. Atherosclerotic calcification within the carotid siphon regions. Stenosis estimated at 50% on each side. The anterior and middle cerebral vessels are patent without proximal stenosis, aneurysm vascular malformation. No large or medium vessel occlusion is identified. Primary anterior circulation supply to both posterior cerebral arteries. Posterior circulation: Both vertebral arteries are patent through the foramen magnum to the basilar. Basilar artery is small but does not show focal stenosis. Both superior cerebellar arteries show flow. As noted above, both posterior cerebral arteries receive the majority of there supply from the anterior circulation. Venous sinuses: Patent and normal. Anatomic variants: None significant otherwise. Review of the MIP images confirms the above findings CT Brain Perfusion Findings: ASPECTS: 10 CBF (<30%) Volume: 22mL Perfusion (Tmax>6.0s) volume: 53mL Mismatch Volume: 60mL Infarction Location:None present IMPRESSION: Normal CT perfusion study. Atherosclerotic change at both carotid bifurcations but no stenosis. Atherosclerotic change in the carotid siphon regions without stenosis greater than 50%. No intracranial large  or medium vessel occlusion or correctable proximal stenosis. Aortic atherosclerosis. These results were communicated to Dr. Rory Percy at 12:59 pmon 12/3/2020by text page via the [facility] messaging system. Electronically Signed   By: Nelson Chimes M.D.   On: 03/21/2019 13:01   Ct Head Wo Contrast  Result Date: 03/21/2019 CLINICAL DATA:  Altered mental status and aphasia. EXAM: CT HEAD WITHOUT CONTRAST TECHNIQUE: Contiguous axial images were obtained from the base of the skull through the vertex without intravenous contrast. COMPARISON:  None. FINDINGS: Brain: No evidence of acute infarction, hemorrhage, hydrocephalus, extra-axial collection or mass lesion/mass effect. 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 There is mild diffuse low-attenuation within the subcortical and periventricular white matter compatible with chronic microvascular disease. Vascular: No hyperdense vessel or unexpected calcification. Skull: Normal. Negative for fracture or focal lesion. Sinuses/Orbits: Paranasal sinuses are clear. Mastoid air cells are clear. Post op changes involving both orbits noted. Other: None IMPRESSION: 1. No acute intracranial abnormalities. 2. Chronic small vessel ischemic change . Electronically Signed   By: Kerby Moors M.D.   On: 03/21/2019 12:08   Ct Code Stroke Cta Neck W/wo Contrast  Result Date: 03/21/2019 CLINICAL DATA:  Altered mental status.  Aphasia. EXAM: CT ANGIOGRAPHY HEAD AND NECK CT PERFUSION BRAIN TECHNIQUE: Multidetector CT imaging of the head and neck was performed using the standard protocol during bolus administration of intravenous contrast. Multiplanar CT image reconstructions and MIPs were obtained to evaluate the vascular anatomy. Carotid stenosis measurements (when applicable) are obtained utilizing NASCET criteria, using the distal internal carotid diameter as the denominator. Multiphase CT imaging of the brain was performed following IV bolus contrast injection. Subsequent  parametric perfusion maps were calculated using RAPID software. CONTRAST:  167mL OMNIPAQUE IOHEXOL 350 MG/ML SOLN COMPARISON:  Head CT earlier same day FINDINGS: CTA NECK FINDINGS Aortic arch: Aortic atherosclerosis. No aneurysm or dissection. Branching pattern is normal without origin stenosis. Right carotid system: Common carotid artery widely patent to the bifurcation. Calcified plaque at the carotid bifurcation and ICA bulb. No stenosis when compared to the more distal cervical ICA diameter. Some plaque affecting the distal cervical ICA but no stenosis. Left carotid system: Common carotid artery widely patent to the bifurcation. Calcified  plaque at the carotid bifurcation and ICA bulb but no stenosis. Distal cervical ICA shows some plaque but no stenosis. Vertebral arteries: Both vertebral artery origins are widely patent. Both vertebral arteries appear approximately equal in size an widely patent through the cervical region to the foramen magnum. Skeleton: Minimal cervical spondylosis. Other neck: No mass or lymphadenopathy. Upper chest: Emphysema and pulmonary scarring.  No focal lesion. Review of the MIP images confirms the above findings CTA HEAD FINDINGS Anterior circulation: Both internal carotid arteries are patent through the skull base and siphon regions. Atherosclerotic calcification within the carotid siphon regions. Stenosis estimated at 50% on each side. The anterior and middle cerebral vessels are patent without proximal stenosis, aneurysm vascular malformation. No large or medium vessel occlusion is identified. Primary anterior circulation supply to both posterior cerebral arteries. Posterior circulation: Both vertebral arteries are patent through the foramen magnum to the basilar. Basilar artery is small but does not show focal stenosis. Both superior cerebellar arteries show flow. As noted above, both posterior cerebral arteries receive the majority of there supply from the anterior circulation.  Venous sinuses: Patent and normal. Anatomic variants: None significant otherwise. Review of the MIP images confirms the above findings CT Brain Perfusion Findings: ASPECTS: 10 CBF (<30%) Volume: 43mL Perfusion (Tmax>6.0s) volume: 47mL Mismatch Volume: 49mL Infarction Location:None present IMPRESSION: Normal CT perfusion study. Atherosclerotic change at both carotid bifurcations but no stenosis. Atherosclerotic change in the carotid siphon regions without stenosis greater than 50%. No intracranial large or medium vessel occlusion or correctable proximal stenosis. Aortic atherosclerosis. These results were communicated to Dr. Rory Percy at 12:59 pmon 12/3/2020by text page via the [facility] messaging system. Electronically Signed   By: Nelson Chimes M.D.   On: 03/21/2019 13:01   Ct Code Stroke Cerebral Perfusion With Contrast  Result Date: 03/21/2019 CLINICAL DATA:  Altered mental status.  Aphasia. EXAM: CT ANGIOGRAPHY HEAD AND NECK CT PERFUSION BRAIN TECHNIQUE: Multidetector CT imaging of the head and neck was performed using the standard protocol during bolus administration of intravenous contrast. Multiplanar CT image reconstructions and MIPs were obtained to evaluate the vascular anatomy. Carotid stenosis measurements (when applicable) are obtained utilizing NASCET criteria, using the distal internal carotid diameter as the denominator. Multiphase CT imaging of the brain was performed following IV bolus contrast injection. Subsequent parametric perfusion maps were calculated using RAPID software. CONTRAST:  18mL OMNIPAQUE IOHEXOL 350 MG/ML SOLN COMPARISON:  Head CT earlier same day FINDINGS: CTA NECK FINDINGS Aortic arch: Aortic atherosclerosis. No aneurysm or dissection. Branching pattern is normal without origin stenosis. Right carotid system: Common carotid artery widely patent to the bifurcation. Calcified plaque at the carotid bifurcation and ICA bulb. No stenosis when compared to the more distal cervical ICA  diameter. Some plaque affecting the distal cervical ICA but no stenosis. Left carotid system: Common carotid artery widely patent to the bifurcation. Calcified plaque at the carotid bifurcation and ICA bulb but no stenosis. Distal cervical ICA shows some plaque but no stenosis. Vertebral arteries: Both vertebral artery origins are widely patent. Both vertebral arteries appear approximately equal in size an widely patent through the cervical region to the foramen magnum. Skeleton: Minimal cervical spondylosis. Other neck: No mass or lymphadenopathy. Upper chest: Emphysema and pulmonary scarring.  No focal lesion. Review of the MIP images confirms the above findings CTA HEAD FINDINGS Anterior circulation: Both internal carotid arteries are patent through the skull base and siphon regions. Atherosclerotic calcification within the carotid siphon regions. Stenosis estimated at 50% on each side.  The anterior and middle cerebral vessels are patent without proximal stenosis, aneurysm vascular malformation. No large or medium vessel occlusion is identified. Primary anterior circulation supply to both posterior cerebral arteries. Posterior circulation: Both vertebral arteries are patent through the foramen magnum to the basilar. Basilar artery is small but does not show focal stenosis. Both superior cerebellar arteries show flow. As noted above, both posterior cerebral arteries receive the majority of there supply from the anterior circulation. Venous sinuses: Patent and normal. Anatomic variants: None significant otherwise. Review of the MIP images confirms the above findings CT Brain Perfusion Findings: ASPECTS: 10 CBF (<30%) Volume: 47mL Perfusion (Tmax>6.0s) volume: 90mL Mismatch Volume: 39mL Infarction Location:None present IMPRESSION: Normal CT perfusion study. Atherosclerotic change at both carotid bifurcations but no stenosis. Atherosclerotic change in the carotid siphon regions without stenosis greater than 50%. No  intracranial large or medium vessel occlusion or correctable proximal stenosis. Aortic atherosclerosis. These results were communicated to Dr. Rory Percy at 12:59 pmon 12/3/2020by text page via the [facility] messaging system. Electronically Signed   By: Nelson Chimes M.D.   On: 03/21/2019 13:01    EKG: Independently reviewed.  Sinus rhythm at 96 bpm  Assessment/Plan Acute metabolic encephalopathy, aphasia: Patient presents acutely altered with expressive aphasia.  CT scan of the brain along with CTA of the head and neck did not show any clear stroke or significant large vessel occlusion.  Patient was out of the window for TPA. -Admit to progressive bed -Neurochecks -Follow-up UDS, TSH, acetaminophen level, salicylate level -Follow-up MRI -Speech therapy to evaluate in a.m. -Appreciate neurology consultative services, we will follow-up for further recommendation  Acute on chronic respiratory failure with hypoxia and hypercapnia, COPD without acute exacerbation: Initial ABG revealed pH 7.315 with PCO2 70.5.  Patient supposed to be on CPAP at night but was not wearing it.  He was just admitted at [facility] for the same from 11/29-12/1.  Lung sounds otherwise clear.  Suspect patient's altered state could be coming from his hypercapnia. -Continuous pulse oximetry with oxygen to maintain O2 saturations -BiPAP -N.p.o. while on BiPAP -DuoNebs as needed  SIRS: Acute.  Upon admission patient was seen to be tachycardic and tachypneic.  WBC elevated at 12.5.  Chest x-ray and urinalysis did not show any clear signs of infection.  Question of some viral etiology. -Check blood cultures -Check respiratory virus panel -Check lactic acid  Acute kidney injury: Acute.  Patient presents with creatinine 2.4 with BUN 27.  On 12/1 patient creatinine had been 1.12 on care everywhere records from [facility]. -Strict intake and output -Check CK -Bladder scan -Continue IV fluids normal saline at  100 mL/h -Recheck creatinine in a.m.  Elevated transaminitis: On admission AST 490 and ALT 457.  Wife notes that the patient has not drank any alcohol in several years. -Follow-up CK question rhabdo -Recheck CMP in a.m.  Generalized weakness and falls: Wife notes that patient has had significant weakness and has been falling. -PT/OT to evaluate and treat  OSA on CPAP -Will need to stress to the patient and family that he must wear CPAP at night  Will need to complete med reconciliation once performed by pharmacy  DVT prophylaxis: Heparin Code Status: Full  Family Communication: Discussed plan of care with the patient's wife over the phone Disposition Plan: TBD  Consults called: None Admission status: inpatient  Norval Morton MD [facility] Pager [phone]   If 7PM-7AM, please contact night-coverage [email or web address] Password [facility]  03/21/2019, 6:55 PM

## 2019-03-21 NOTE — ED Provider Notes (Signed)
[facility] EMERGENCY DEPARTMENT Provider Note   CSN: [id number] Arrival date & time: 03/21/19  1145     History   Chief Complaint No chief complaint on file.   HPI Phillip Owens is a 78 y.o. male.     HPI Patient presents from home via EMS with aphasia, weakness, possible confusion. History is provided by EMS providers and the patient himself cannot speak clearly, interacts only occasionally. Level 5 caveat. EMS reports that family reported that the patient was last seen normal 14 hours ago. Today, he was found to have new word finding difficulty, difficulty with communication, and was less interactive than usual. EMS reports patient was hypertensive on arrival, improved somewhat in route, but was awake, alert, noninteractive. No report of fall, trauma, no report of vomiting, diarrhea, no reported fever. Patient does reportedly wear oxygen 24/7, and this was possibly displaced at some point over the past day, but with 4 L via nasal cannula patient has had appropriate saturation in route.   No past medical history on file.  Patient new to the system, level 5 caveat secondary to the patient's altered mental status.  Home Medications    Prior to Admission medications   Not on File    Family History  Unavailable secondary to mental status changes.  No family history on file.  Social History  Unavailable secondary to mental status changes  Social History   Tobacco Use   Smoking status: Not on file  Substance Use Topics   Alcohol use: Not on file   Drug use: Not on file     Allergies   Patient has no known allergies.   Review of Systems Review of Systems  Unable to perform ROS: Acuity of condition     Physical Exam Updated Vital Signs There were no vitals taken for this visit.  Physical Exam Vitals signs and nursing note reviewed.  Constitutional:      Appearance: He is well-developed. He is ill-appearing.     Comments:  Ill-appearing elderly male withdrawn, responds to verbal stimuli, transiently.  HENT:     Head: Normocephalic and atraumatic.  Eyes:     Conjunctiva/sclera: Conjunctivae normal.  Neck:     Comments: No gross deformities Cardiovascular:     Rate and Rhythm: Normal rate and regular rhythm.  Pulmonary:     Effort: Pulmonary effort is normal. No respiratory distress.     Breath sounds: No stridor.  Abdominal:     General: There is no distension.  Skin:    General: Skin is warm and dry.  Neurological:     Mental Status: He is alert.     Comments: Gross dysarthria, speech is inconsistent, intermittent.  Gross atrophy appreciable, mild tremor in upper extremities, patient does not follow neurologic commands regularly.   Psychiatric:        Behavior: Behavior is withdrawn.        Cognition and Memory: Cognition is impaired.      ED Treatments / Results  Labs (all labs ordered are listed, but only abnormal results are displayed) Labs Reviewed  PROTIME-INR - Abnormal; Notable for the following components:      Result Value   Prothrombin Time 16.1 (*)    INR 1.3 (*)    All other components within normal limits  APTT - Abnormal; Notable for the following components:   aPTT 23 (*)    All other components within normal limits  CBC - Abnormal; Notable for the following components:  WBC 12.5 (*)    Hemoglobin 12.7 (*)    MCHC 29.7 (*)    RDW 15.7 (*)    nRBC 0.3 (*)    All other components within normal limits  DIFFERENTIAL - Abnormal; Notable for the following components:   Neutro Abs 11.1 (*)    Lymphs Abs 0.5 (*)    Abs Immature Granulocytes 0.15 (*)    All other components within normal limits  COMPREHENSIVE METABOLIC PANEL - Abnormal; Notable for the following components:   Potassium 6.4 (*)    Chloride 94 (*)    Glucose, Bld 156 (*)    BUN 27 (*)    Creatinine, Ser 2.40 (*)    Total Protein 8.2 (*)    Albumin 3.2 (*)    AST 490 (*)    ALT 457 (*)    GFR calc non Af  Amer 25 (*)    GFR calc Af Amer 29 (*)    All other components within normal limits  I-STAT CHEM 8, ED - Abnormal; Notable for the following components:   Potassium 6.4 (*)    Chloride 97 (*)    BUN 38 (*)    Creatinine, Ser 2.30 (*)    Glucose, Bld 153 (*)    Calcium, Ion 0.97 (*)    TCO2 35 (*)    All other components within normal limits  POCT I-STAT 7, (LYTES, BLD GAS, ICA,H+H) - Abnormal; Notable for the following components:   pH, Arterial 7.315 (*)    pCO2 arterial 70.5 (*)    pO2, Arterial 61.0 (*)    Bicarbonate 36.1 (*)    TCO2 38 (*)    Acid-Base Excess 7.0 (*)    Potassium 5.4 (*)    HCT 37.0 (*)    Hemoglobin 12.6 (*)    All other components within normal limits  RAPID URINE DRUG SCREEN, HOSP PERFORMED  URINALYSIS, ROUTINE W REFLEX MICROSCOPIC  BLOOD GAS, ARTERIAL  AMMONIA  TSH  VITAMIN B12  POC SARS CORONAVIRUS 2 AG -  ED    EKG EKG Interpretation  Date/Time:  Thursday March 21 2019 12:48:09 EST Ventricular Rate:  96 PR Interval:    QRS Duration: 87 QT Interval:  341 QTC Calculation: 431 R Axis:   82 Text Interpretation: Sinus rhythm Premature atrial complexes Artifact ST-t wave abnormality Abnormal ECG Confirmed by Carmin Muskrat [phone]) on 03/21/2019 1:00:49 PM   Radiology Ct Code Stroke Cta Head W/wo Contrast  Result Date: 03/21/2019 CLINICAL DATA:  Altered mental status.  Aphasia. EXAM: CT ANGIOGRAPHY HEAD AND NECK CT PERFUSION BRAIN TECHNIQUE: Multidetector CT imaging of the head and neck was performed using the standard protocol during bolus administration of intravenous contrast. Multiplanar CT image reconstructions and MIPs were obtained to evaluate the vascular anatomy. Carotid stenosis measurements (when applicable) are obtained utilizing NASCET criteria, using the distal internal carotid diameter as the denominator. Multiphase CT imaging of the brain was performed following IV bolus contrast injection. Subsequent parametric perfusion maps  were calculated using RAPID software. CONTRAST:  150mL OMNIPAQUE IOHEXOL 350 MG/ML SOLN COMPARISON:  Head CT earlier same day FINDINGS: CTA NECK FINDINGS Aortic arch: Aortic atherosclerosis. No aneurysm or dissection. Branching pattern is normal without origin stenosis. Right carotid system: Common carotid artery widely patent to the bifurcation. Calcified plaque at the carotid bifurcation and ICA bulb. No stenosis when compared to the more distal cervical ICA diameter. Some plaque affecting the distal cervical ICA but no stenosis. Left carotid system: Common carotid artery widely  patent to the bifurcation. Calcified plaque at the carotid bifurcation and ICA bulb but no stenosis. Distal cervical ICA shows some plaque but no stenosis. Vertebral arteries: Both vertebral artery origins are widely patent. Both vertebral arteries appear approximately equal in size an widely patent through the cervical region to the foramen magnum. Skeleton: Minimal cervical spondylosis. Other neck: No mass or lymphadenopathy. Upper chest: Emphysema and pulmonary scarring.  No focal lesion. Review of the MIP images confirms the above findings CTA HEAD FINDINGS Anterior circulation: Both internal carotid arteries are patent through the skull base and siphon regions. Atherosclerotic calcification within the carotid siphon regions. Stenosis estimated at 50% on each side. The anterior and middle cerebral vessels are patent without proximal stenosis, aneurysm vascular malformation. No large or medium vessel occlusion is identified. Primary anterior circulation supply to both posterior cerebral arteries. Posterior circulation: Both vertebral arteries are patent through the foramen magnum to the basilar. Basilar artery is small but does not show focal stenosis. Both superior cerebellar arteries show flow. As noted above, both posterior cerebral arteries receive the majority of there supply from the anterior circulation. Venous sinuses: Patent  and normal. Anatomic variants: None significant otherwise. Review of the MIP images confirms the above findings CT Brain Perfusion Findings: ASPECTS: 10 CBF (<30%) Volume: 91mL Perfusion (Tmax>6.0s) volume: 23mL Mismatch Volume: 30mL Infarction Location:None present IMPRESSION: Normal CT perfusion study. Atherosclerotic change at both carotid bifurcations but no stenosis. Atherosclerotic change in the carotid siphon regions without stenosis greater than 50%. No intracranial large or medium vessel occlusion or correctable proximal stenosis. Aortic atherosclerosis. These results were communicated to Dr. Rory Percy at 12:59 pmon 12/3/2020by text page via the [facility] messaging system. Electronically Signed   By: Nelson Chimes M.D.   On: 03/21/2019 13:01   Ct Head Wo Contrast  Result Date: 03/21/2019 CLINICAL DATA:  Altered mental status and aphasia. EXAM: CT HEAD WITHOUT CONTRAST TECHNIQUE: Contiguous axial images were obtained from the base of the skull through the vertex without intravenous contrast. COMPARISON:  None. FINDINGS: Brain: No evidence of acute infarction, hemorrhage, hydrocephalus, extra-axial collection or mass lesion/mass effect. 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 There is mild diffuse low-attenuation within the subcortical and periventricular white matter compatible with chronic microvascular disease. Vascular: No hyperdense vessel or unexpected calcification. Skull: Normal. Negative for fracture or focal lesion. Sinuses/Orbits: Paranasal sinuses are clear. Mastoid air cells are clear. Post op changes involving both orbits noted. Other: None IMPRESSION: 1. No acute intracranial abnormalities. 2. Chronic small vessel ischemic change . Electronically Signed   By: Kerby Moors M.D.   On: 03/21/2019 12:08   Ct Code Stroke Cta Neck W/wo Contrast  Result Date: 03/21/2019 CLINICAL DATA:  Altered mental status.  Aphasia. EXAM: CT ANGIOGRAPHY HEAD AND NECK CT PERFUSION BRAIN TECHNIQUE:  Multidetector CT imaging of the head and neck was performed using the standard protocol during bolus administration of intravenous contrast. Multiplanar CT image reconstructions and MIPs were obtained to evaluate the vascular anatomy. Carotid stenosis measurements (when applicable) are obtained utilizing NASCET criteria, using the distal internal carotid diameter as the denominator. Multiphase CT imaging of the brain was performed following IV bolus contrast injection. Subsequent parametric perfusion maps were calculated using RAPID software. CONTRAST:  172mL OMNIPAQUE IOHEXOL 350 MG/ML SOLN COMPARISON:  Head CT earlier same day FINDINGS: CTA NECK FINDINGS Aortic arch: Aortic atherosclerosis. No aneurysm or dissection. Branching pattern is normal without origin stenosis. Right carotid system: Common carotid artery widely patent to the bifurcation. Calcified plaque  at the carotid bifurcation and ICA bulb. No stenosis when compared to the more distal cervical ICA diameter. Some plaque affecting the distal cervical ICA but no stenosis. Left carotid system: Common carotid artery widely patent to the bifurcation. Calcified plaque at the carotid bifurcation and ICA bulb but no stenosis. Distal cervical ICA shows some plaque but no stenosis. Vertebral arteries: Both vertebral artery origins are widely patent. Both vertebral arteries appear approximately equal in size an widely patent through the cervical region to the foramen magnum. Skeleton: Minimal cervical spondylosis. Other neck: No mass or lymphadenopathy. Upper chest: Emphysema and pulmonary scarring.  No focal lesion. Review of the MIP images confirms the above findings CTA HEAD FINDINGS Anterior circulation: Both internal carotid arteries are patent through the skull base and siphon regions. Atherosclerotic calcification within the carotid siphon regions. Stenosis estimated at 50% on each side. The anterior and middle cerebral vessels are patent without proximal  stenosis, aneurysm vascular malformation. No large or medium vessel occlusion is identified. Primary anterior circulation supply to both posterior cerebral arteries. Posterior circulation: Both vertebral arteries are patent through the foramen magnum to the basilar. Basilar artery is small but does not show focal stenosis. Both superior cerebellar arteries show flow. As noted above, both posterior cerebral arteries receive the majority of there supply from the anterior circulation. Venous sinuses: Patent and normal. Anatomic variants: None significant otherwise. Review of the MIP images confirms the above findings CT Brain Perfusion Findings: ASPECTS: 10 CBF (<30%) Volume: 66mL Perfusion (Tmax>6.0s) volume: 60mL Mismatch Volume: 33mL Infarction Location:None present IMPRESSION: Normal CT perfusion study. Atherosclerotic change at both carotid bifurcations but no stenosis. Atherosclerotic change in the carotid siphon regions without stenosis greater than 50%. No intracranial large or medium vessel occlusion or correctable proximal stenosis. Aortic atherosclerosis. These results were communicated to Dr. Rory Percy at 12:59 pmon 12/3/2020by text page via the [facility] messaging system. Electronically Signed   By: Nelson Chimes M.D.   On: 03/21/2019 13:01   Ct Code Stroke Cerebral Perfusion With Contrast  Result Date: 03/21/2019 CLINICAL DATA:  Altered mental status.  Aphasia. EXAM: CT ANGIOGRAPHY HEAD AND NECK CT PERFUSION BRAIN TECHNIQUE: Multidetector CT imaging of the head and neck was performed using the standard protocol during bolus administration of intravenous contrast. Multiplanar CT image reconstructions and MIPs were obtained to evaluate the vascular anatomy. Carotid stenosis measurements (when applicable) are obtained utilizing NASCET criteria, using the distal internal carotid diameter as the denominator. Multiphase CT imaging of the brain was performed following IV bolus contrast injection. Subsequent parametric  perfusion maps were calculated using RAPID software. CONTRAST:  11mL OMNIPAQUE IOHEXOL 350 MG/ML SOLN COMPARISON:  Head CT earlier same day FINDINGS: CTA NECK FINDINGS Aortic arch: Aortic atherosclerosis. No aneurysm or dissection. Branching pattern is normal without origin stenosis. Right carotid system: Common carotid artery widely patent to the bifurcation. Calcified plaque at the carotid bifurcation and ICA bulb. No stenosis when compared to the more distal cervical ICA diameter. Some plaque affecting the distal cervical ICA but no stenosis. Left carotid system: Common carotid artery widely patent to the bifurcation. Calcified plaque at the carotid bifurcation and ICA bulb but no stenosis. Distal cervical ICA shows some plaque but no stenosis. Vertebral arteries: Both vertebral artery origins are widely patent. Both vertebral arteries appear approximately equal in size an widely patent through the cervical region to the foramen magnum. Skeleton: Minimal cervical spondylosis. Other neck: No mass or lymphadenopathy. Upper chest: Emphysema and pulmonary scarring.  No focal lesion. Review  of the MIP images confirms the above findings CTA HEAD FINDINGS Anterior circulation: Both internal carotid arteries are patent through the skull base and siphon regions. Atherosclerotic calcification within the carotid siphon regions. Stenosis estimated at 50% on each side. The anterior and middle cerebral vessels are patent without proximal stenosis, aneurysm vascular malformation. No large or medium vessel occlusion is identified. Primary anterior circulation supply to both posterior cerebral arteries. Posterior circulation: Both vertebral arteries are patent through the foramen magnum to the basilar. Basilar artery is small but does not show focal stenosis. Both superior cerebellar arteries show flow. As noted above, both posterior cerebral arteries receive the majority of there supply from the anterior circulation. Venous  sinuses: Patent and normal. Anatomic variants: None significant otherwise. Review of the MIP images confirms the above findings CT Brain Perfusion Findings: ASPECTS: 10 CBF (<30%) Volume: 65mL Perfusion (Tmax>6.0s) volume: 12mL Mismatch Volume: 62mL Infarction Location:None present IMPRESSION: Normal CT perfusion study. Atherosclerotic change at both carotid bifurcations but no stenosis. Atherosclerotic change in the carotid siphon regions without stenosis greater than 50%. No intracranial large or medium vessel occlusion or correctable proximal stenosis. Aortic atherosclerosis. These results were communicated to Dr. Rory Percy at 12:59 pmon 12/3/2020by text page via the [facility] messaging system. Electronically Signed   By: Nelson Chimes M.D.   On: 03/21/2019 13:01    Procedures  CRITICAL CARE Performed by: Carmin Muskrat Total critical care time: 35 minutes Critical care time was exclusive of separately billable procedures and treating other patients. Critical care was necessary to treat or prevent imminent or life-threatening deterioration. Critical care was time spent personally by me on the following activities: development of treatment plan with patient and/or surrogate as well as nursing, discussions with consultants, evaluation of patient's response to treatment, examination of patient, obtaining history from patient or surrogate, ordering and performing treatments and interventions, ordering and review of laboratory studies, ordering and review of radiographic studies, pulse oximetry and re-evaluation of patient's condition.   Medications Ordered in ED Medications  sodium chloride 0.9 % bolus 500 mL (500 mLs Intravenous New Bag/Given 03/21/19 1255)    Followed by  0.9 %  sodium chloride infusion (has no administration in time range)  insulin aspart (novoLOG) injection 5 Units (has no administration in time range)    And  dextrose 50 % solution 50 mL (has no administration in time range)  sodium  bicarbonate injection 50 mEq (has no administration in time range)  iohexol (OMNIPAQUE) 350 MG/ML injection 100 mL (100 mLs Intravenous Contrast Given 03/21/19 1221)     Initial Impression / Assessment and Plan / ED Course  I have reviewed the triage vital signs and the nursing notes.  Pertinent labs & imaging results that were available during my care of the patient were reviewed by me and considered in my medical decision making (see chart for details).    Reviewed the patient's chart after my initial evaluation, with hospital course from visit to outside hospital earlier this week as below:  Hospital Course: Acute on chronic respiratory failure with hypoxia and hypercapnia. Patient is chronically on 3 L of oxygen at home continuously. He was hypoxic when EMS arrived and oxygen was increased to 6 L. ABG done in ED shows a pH of 7.33 PCO2 of 72 PO2 of 67. Patient has been weaned down to 3 L and currently saturating 92%. Continue present oxygen regimen to keep O2 saturation above 88%.  Obstructive sleep apnea. Patient uses CPAP at night. Patient is unable  to bring his home equipment. 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  Chronic angle-closure glaucoma of both eyes. Present on admission. Continue home medications.  Suspected COVID-19 virus infection. SARS-CoV-2 test negative. Contact/droplet isolation discontinued.  Acute febrile illness 2/2 CAP.Patient had a temperature of 102.2 F. mild productive cough. Chest x-ray shows no new infiltrate, chronic left basal scaring. Lactate level was normal Blood culture negative  Patient has been started on doxycycline and will complete 5 days.  Debility. Patient has frequent falls secondary past several weeks. Patient lives at home with the wife who had a recent back surgery. Patient was evaluated by PT OT initially recommended SNF, patient was ambulating with minimal assistance the next  day in the hallway and PT OT recommended home health/PT. Patient wife agreed and wanted him to be home with home health PT. Patient was discharged home with wife.  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 Resume home medication     12:06 PM Head CT does not demonstrate acute hemorrhage, but with concern for speech deficit, abnormal neuro exam and discussed his presentation with our neurology colleagues, and he was subsequently designated as a code stroke.   12:17 PM Patient returning to CTA   1:18 PM Code stroke canceled.,  Patient will have MRI, but no early evidence for acute stroke, continued suspicion for toxic encephalopathy, possibly to displacement of his oxygen overnight. Family members now present, they note that at baseline the patient ambulates with a walker, is oriented appropriately, though does have chronic debilitation. I was able to obtain a note from prior outside hospital lab studies, the patient has had creatinine as high as 2.2 recently, though no substantial elevation in his potassium.  On however, today the patient is found to have acute kidney injury, and hyperkalemia. Patient will require meds for hyperkalemia, ongoing fluids. Whereas before he was hospitalized with elevated creatinine, he did return to baseline values prior to discharge, seemingly.   Final Clinical Impressions(s) / ED Diagnoses   Final diagnoses:  Encephalopathy  AKI (acute kidney injury) ([place])  Hyperkalemia     Carmin Muskrat, MD 03/21/19 1335

## 2019-03-21 NOTE — ED Notes (Signed)
Neuro at bedside.

## 2019-03-21 NOTE — ED Triage Notes (Signed)
Pt presents from home for AMS, aphasia, R eye deviation., last seen well at 1130p last night, found by family this AM. Per EMS, unable to assess VAN d/t not following commands, muscle tone noted in all ext, fights attempts to light arms and legs for assessment.  Seen Tues at [facility] for low O2, uses 4L at home, required 6L with EMS.Answers "yes" to EMS when asked if having trouble getting his words out, speech otherwise incomprehensible.

## 2019-03-21 NOTE — ED Notes (Signed)
ED TO INPATIENT HANDOFF REPORT  ED Nurse Name and Phone #:  [phone]  S Name/Age/Gender Phillip Owens 78 y.o. male Room/Bed: 010C/010C  Code Status   Code Status: Full Code  Home/SNF/Other Home Patient oriented to: self Is this baseline? No   Triage Complete: Triage complete  Chief Complaint ams  Triage Note Pt presents from home for AMS, aphasia, R eye deviation., last seen well at 1130p last night, found by family this AM. Per EMS, unable to assess VAN d/t not following commands, muscle tone noted in all ext, fights attempts to light arms and legs for assessment.  Seen Tues at [facility] for low O2, uses 4L at home, required 6L with EMS.Answers "yes" to EMS when asked if having trouble getting his words out, speech otherwise incomprehensible.  Pt family arrives, reports that pt tremors typically, does not see well out of L eye. Adds that pt was off his 4L O2 at home for an unknown amount of time last night.   Allergies No Known Allergies  Level of Care/Admitting Diagnosis ED Disposition    ED Disposition Condition [facility] Area: [place] [[id number]]  Level of Care: Progressive [102]  Admit to Progressive based on following criteria: RESPIRATORY PROBLEMS hypoxemic/hypercapnic respiratory failure that is responsive to NIPPV (BiPAP) or High Flow Nasal Cannula (6-80 lpm). Frequent assessment/intervention, no > Q2 hrs < Q4 hrs, to maintain oxygenation and pulmonary hygiene.  Covid Evaluation: Confirmed COVID Negative  Date Laboratory Confirmed COVID Negative: 03/21/2019  Diagnosis: Acute on chronic respiratory failure with hypoxia and hypercapnia [facility]) [[id number]]  Admitting Physician: Norval Morton [[id number]]  Attending Physician: Norval Morton [[id number]]  Estimated length of stay: past midnight tomorrow  Certification:: I certify this patient will need inpatient services for at least 2 midnights  PT Class (Do Not Modify): Inpatient  [101]  PT Acc Code (Do Not Modify): Private [1]       B Medical/Surgery History Past Medical History:  Diagnosis Date  . Cancer ([place])   . Chronic respiratory failure with hypoxia, on home O2 therapy ([place])   . COPD (chronic obstructive pulmonary disease) ([place])   . Emphysema (subcutaneous) (surgical) resulting from a procedure   . Essential hypertension   . GERD (gastroesophageal reflux disease)   . Hyperglycemia   . Hyperlipidemia   . Multiple lung nodules on CT   . Obstructive sleep apnea on CPAP   . Oxygen dependent   . Renal insufficiency bilateral   . Sensorineural hearing loss       A IV Location/Drains/Wounds Patient Lines/Drains/Airways Status   Active Line/Drains/Airways    Name:   Placement date:   Placement time:   Site:   Days:   Peripheral IV 03/21/19 Left Antecubital   03/21/19    1255    Antecubital   less than 1          Intake/Output Last 24 hours  Intake/Output Summary (Last 24 hours) at 03/21/2019 1936 Last data filed at 03/21/2019 1454 Gross per 24 hour  Intake 500 ml  Output -  Net 500 ml    Labs/Imaging Results for orders placed or performed during the hospital encounter of 03/21/19 (from the past 48 hour(s))  CBG monitoring, ED     Status: Abnormal   Collection Time: 03/21/19 11:45 AM  Result Value Ref Range   Glucose-Capillary 119 (H) 70 - 99 mg/dL   Comment 1 Notify RN    Comment 2 Document in Chart  Protime-INR     Status: Abnormal   Collection Time: 03/21/19 12:15 PM  Result Value Ref Range   Prothrombin Time 16.1 (H) 11.4 - 15.2 seconds   INR 1.3 (H) 0.8 - 1.2    Comment: (NOTE) INR goal varies based on device and disease states. Performed at [facility], [place] [address] [place], [place] [address]   APTT     Status: Abnormal   Collection Time: 03/21/19 12:15 PM  Result Value Ref Range   aPTT 23 (L) 24 - 36 seconds    Comment: Performed at [facility] [address] [place], [place] [address]  CBC      Status: Abnormal   Collection Time: 03/21/19 12:15 PM  Result Value Ref Range   WBC 12.5 (H) 4.0 - 10.5 K/uL   RBC 4.68 4.22 - 5.81 MIL/uL   Hemoglobin 12.7 (L) 13.0 - 17.0 g/dL   HCT 42.8 39.0 - 52.0 %   MCV 91.5 80.0 - 100.0 fL   MCH 27.1 26.0 - 34.0 pg   MCHC 29.7 (L) 30.0 - 36.0 g/dL   RDW 15.7 (H) 11.5 - 15.5 %   Platelets 219 150 - 400 K/uL   nRBC 0.3 (H) 0.0 - 0.2 %    Comment: Performed at [facility] [address], [place], [place] [address]  Differential     Status: Abnormal   Collection Time: 03/21/19 12:15 PM  Result Value Ref Range   Neutrophils Relative % 89 %   Neutro Abs 11.1 (H) 1.7 - 7.7 K/uL   Lymphocytes Relative 4 %   Lymphs Abs 0.5 (L) 0.7 - 4.0 K/uL   Monocytes Relative 6 %   Monocytes Absolute 0.8 0.1 - 1.0 K/uL   Eosinophils Relative 0 %   Eosinophils Absolute 0.0 0.0 - 0.5 K/uL   Basophils Relative 0 %   Basophils Absolute 0.0 0.0 - 0.1 K/uL   Immature Granulocytes 1 %   Abs Immature Granulocytes 0.15 (H) 0.00 - 0.07 K/uL    Comment: Performed at [facility] [address], [place], [place] [address]  Comprehensive metabolic panel     Status: Abnormal   Collection Time: 03/21/19 12:15 PM  Result Value Ref Range   Sodium 139 135 - 145 mmol/L   Potassium 6.4 (HH) 3.5 - 5.1 mmol/L    Comment: SLIGHT HEMOLYSIS CRITICAL RESULT CALLED TO, READ BACK BY AND VERIFIED WITH: B.HONEYCUTT,RN 03/21/2019 1312 DAVISB    Chloride 94 (L) 98 - 111 mmol/L   CO2 31 22 - 32 mmol/L   Glucose, Bld 156 (H) 70 - 99 mg/dL   BUN 27 (H) 8 - 23 mg/dL   Creatinine, Ser 2.40 (H) 0.61 - 1.24 mg/dL   Calcium 8.9 8.9 - 10.3 mg/dL   Total Protein 8.2 (H) 6.5 - 8.1 g/dL   Albumin 3.2 (L) 3.5 - 5.0 g/dL   AST 490 (H) 15 - 41 U/L   ALT 457 (H) 0 - 44 U/L   Alkaline Phosphatase 92 38 - 126 U/L   Total Bilirubin 1.1 0.3 - 1.2 mg/dL   GFR calc non Af Amer 25 (L) >60 mL/min   GFR calc Af Amer 29 (L) >60 mL/min   Anion gap 14 5 - 15    Comment: Performed at [facility], [place] [address] [place], [place] [address]  I-stat chem 8, ED     Status: Abnormal   Collection Time: 03/21/19 12:16 PM  Result Value Ref Range   Sodium 136 135 - 145 mmol/L   Potassium 6.4 (HH) 3.5 - 5.1 mmol/L   Chloride 97 (L) 98 - 111 mmol/L   BUN 38 (H) 8 - 23 mg/dL   Creatinine, Ser 2.30 (H) 0.61 - 1.24 mg/dL   Glucose, Bld 153 (H) 70 - 99 mg/dL   Calcium, Ion 0.97 (L) 1.15 - 1.40 mmol/L   TCO2 35 (H) 22 - 32 mmol/L   Hemoglobin 14.6 13.0 - 17.0 g/dL   HCT 43.0 39.0 - 52.0 %   Comment NOTIFIED PHYSICIAN   I-STAT 7, (LYTES, BLD GAS, ICA, H+H)     Status: Abnormal   Collection Time: 03/21/19  1:10 PM  Result Value Ref Range   pH, Arterial 7.315 (L) 7.350 - 7.450   pCO2 arterial 70.5 (HH) 32.0 - 48.0 mmHg   pO2, Arterial 61.0 (L) 83.0 - 108.0 mmHg   Bicarbonate 36.1 (H) 20.0 - 28.0 mmol/L   TCO2 38 (H) 22 - 32 mmol/L   O2 Saturation 88.0 %   Acid-Base Excess 7.0 (H) 0.0 - 2.0 mmol/L   Sodium 136 135 - 145 mmol/L   Potassium 5.4 (H) 3.5 - 5.1 mmol/L   Calcium, Ion 1.15 1.15 - 1.40 mmol/L   HCT 37.0 (L) 39.0 - 52.0 %   Hemoglobin 12.6 (L) 13.0 - 17.0 g/dL   Patient temperature 98.0 F    Sample type ARTERIAL    Comment NOTIFIED PHYSICIAN   POC SARS Coronavirus 2 Ag-ED - Nasal Swab (BD Veritor Kit)     Status: None   Collection Time: 03/21/19  1:20 PM  Result Value Ref Range   SARS Coronavirus 2 Ag NEGATIVE NEGATIVE    Comment: (NOTE) SARS-CoV-2 antigen NOT DETECTED.  Negative results are presumptive.  Negative results do not preclude SARS-CoV-2 infection and should not be used as the sole basis for treatment or other patient management decisions, including infection  control decisions, particularly in the presence of clinical signs and  symptoms consistent with COVID-19, or in those who have been in contact with the virus.  Negative results must be combined with clinical observations, patient history, and epidemiological information. The expected result is  Negative. Fact Sheet for Patients: PodPark.tn Fact Sheet for Healthcare Providers: GiftContent.is This test is not yet approved or cleared by the [place] FDA and  has been authorized for detection and/or diagnosis of SARS-CoV-2 by FDA under an Emergency Use Authorization (EUA).  This EUA will remain in effect (meaning this test can be used) for the duration of  the COVID-19 de claration under Section 564(b)(1) of the Act, 21 U.S.C. section 360bbb-3(b)(1), unless the authorization is terminated or revoked sooner.   Ammonia     Status: None   Collection Time: 03/21/19  1:46 PM  Result Value Ref Range   Ammonia 23 9 - 35 umol/L    Comment: Performed at [facility], [place] [address] [place], [place] [address]  TSH     Status: None   Collection Time: 03/21/19  1:46 PM  Result Value Ref Range   TSH 3.940 0.350 - 4.500 uIU/mL    Comment: Performed by a 3rd Generation assay with a functional sensitivity of <=0.01 uIU/mL. Performed at [facility], [place] [address] [place], [place] [address]   Potassium     Status: Abnormal   Collection Time: 03/21/19  1:46 PM  Result Value Ref Range   Potassium 5.9 (H) 3.5 - 5.1 mmol/L  Comment: Performed at [facility], [place] [address], [place], [place] [address]  Vitamin B12     Status: None   Collection Time: 03/21/19  1:46 PM  Result Value Ref Range   Vitamin B-12 508 180 - 914 pg/mL    Comment: (NOTE) This assay is not validated for testing neonatal or myeloproliferative syndrome specimens for Vitamin B12 levels. Performed at [facility], [place] [address] [place], [place] [address]   Acetaminophen level     Status: Abnormal   Collection Time: 03/21/19  2:52 PM  Result Value Ref Range   Acetaminophen (Tylenol), Serum <10 (L) 10 - 30 ug/mL    Comment: (NOTE) Therapeutic concentrations vary significantly. A range of 10-30 ug/mL  may be an effective  concentration for many patients. However, some  are best treated at concentrations outside of this range. Acetaminophen concentrations >150 ug/mL at 4 hours after ingestion  and >50 ug/mL at 12 hours after ingestion are often associated with  toxic reactions. Performed at [facility], [place] [address] [place], [place] [address]   Salicylate level     Status: None   Collection Time: 03/21/19  2:52 PM  Result Value Ref Range   Salicylate Lvl <0.0 2.8 - 30.0 mg/dL    Comment: SPECIMEN HEMOLYZED. HEMOLYSIS MAY AFFECT INTEGRITY OF RESULTS. Performed at [facility], [place] [address], [place], [place] [address]   Lactic acid, plasma     Status: Abnormal   Collection Time: 03/21/19  3:16 PM  Result Value Ref Range   Lactic Acid, Venous 2.5 (HH) 0.5 - 1.9 mmol/L    Comment: CRITICAL RESULT CALLED TO, READ BACK BY AND VERIFIED WITH: HONEYCUTT,B RN @ 8891 03/21/19 LEONARD,A Performed at [facility], [place] [address] [place], [place] [address]   SARS Coronavirus 2 by RT PCR (hospital order, performed in [facility] hospital lab) Nasopharyngeal Nasopharyngeal Swab     Status: None   Collection Time: 03/21/19  3:24 PM   Specimen: Nasopharyngeal Swab  Result Value Ref Range   SARS Coronavirus 2 NEGATIVE NEGATIVE    Comment: (NOTE) SARS-CoV-2 target nucleic acids are NOT DETECTED. The SARS-CoV-2 RNA is generally detectable in upper and lower respiratory specimens during the acute phase of infection. The lowest concentration of SARS-CoV-2 viral copies this assay can detect is 250 copies / mL. A negative result does not preclude SARS-CoV-2 infection and should not be used as the sole basis for treatment or other patient management decisions.  A negative result may occur with improper specimen collection / handling, submission of specimen other than nasopharyngeal swab, presence of viral mutation(s) within the areas targeted by this assay, and inadequate number of viral  copies (<250 copies / mL). A negative result must be combined with clinical observations, patient history, and epidemiological information. Fact Sheet for Patients:   StrictlyIdeas.no Fact Sheet for Healthcare Providers: BankingDealers.co.za This test is not yet approved or cleared  by the [place] FDA and has been authorized for detection and/or diagnosis of SARS-CoV-2 by FDA under an Emergency Use Authorization (EUA).  This EUA will remain in effect (meaning this test can be used) for the duration of the COVID-19 declaration under Section 564(b)(1) of the Act, 21 U.S.C. section 360bbb-3(b)(1), unless the authorization is terminated or revoked sooner. Performed at [facility], [place] [address], [place], [place] [address]   CBG monitoring, ED     Status: Abnormal   Collection Time: 03/21/19  5:00 PM  Result Value Ref Range   Glucose-Capillary 154 (H) 70 - 99 mg/dL   Comment 1 Notify RN    Comment 2 Document in Chart   Urine rapid drug screen (hosp performed)not at [facility]     Status: Abnormal   Collection Time: 03/21/19  5:03 PM  Result Value Ref Range   Opiates POSITIVE (A) NONE DETECTED   Cocaine NONE DETECTED NONE DETECTED   Benzodiazepines NONE DETECTED NONE DETECTED   Amphetamines NONE DETECTED NONE DETECTED   Tetrahydrocannabinol NONE DETECTED NONE DETECTED   Barbiturates NONE DETECTED NONE DETECTED    Comment: (NOTE) DRUG SCREEN FOR MEDICAL PURPOSES ONLY.  IF CONFIRMATION IS NEEDED FOR ANY PURPOSE, NOTIFY LAB WITHIN 5 DAYS. LOWEST DETECTABLE LIMITS FOR URINE DRUG SCREEN Drug Class                     Cutoff (ng/mL) Amphetamine and metabolites    1000 Barbiturate and metabolites    200 Benzodiazepine                 492 Tricyclics and metabolites     300 Opiates and metabolites        300 Cocaine and metabolites        300 THC                            50 Performed at [facility], [place] [address],  [place], [place] [address]   Urinalysis, Routine w reflex microscopic (not at [facility])     Status: Abnormal   Collection Time: 03/21/19  5:03 PM  Result Value Ref Range   Color, Urine YELLOW YELLOW   APPearance HAZY (A) CLEAR   Specific Gravity, Urine >1.046 (H) 1.005 - 1.030   pH 5.0 5.0 - 8.0   Glucose, UA 50 (A) NEGATIVE mg/dL   Hgb urine dipstick SMALL (A) NEGATIVE   Bilirubin Urine NEGATIVE NEGATIVE   Ketones, ur NEGATIVE NEGATIVE mg/dL   Protein, ur 30 (A) NEGATIVE mg/dL   Nitrite NEGATIVE NEGATIVE   Leukocytes,Ua NEGATIVE NEGATIVE   RBC / HPF 6-10 0 - 5 RBC/hpf   WBC, UA 0-5 0 - 5 WBC/hpf   Bacteria, UA NONE SEEN NONE SEEN   Squamous Epithelial / LPF 0-5 0 - 5   Mucus PRESENT     Comment: Performed at [facility], [place] [address], [place], [place] [address]  I-STAT 7, (LYTES, BLD GAS, ICA, H+H)     Status: Abnormal   Collection Time: 03/21/19  5:58 PM  Result Value Ref Range   pH, Arterial 7.353 7.350 - 7.450   pCO2 arterial 70.9 (HH) 32.0 - 48.0 mmHg   pO2, Arterial 110.0 (H) 83.0 - 108.0 mmHg   Bicarbonate 39.5 (H) 20.0 - 28.0 mmol/L   TCO2 42 (H) 22 - 32 mmol/L   O2 Saturation 98.0 %   Acid-Base Excess 11.0 (H) 0.0 - 2.0 mmol/L   Sodium 139 135 - 145 mmol/L   Potassium 4.7 3.5 - 5.1 mmol/L   Calcium, Ion 1.16 1.15 - 1.40 mmol/L   HCT 38.0 (L) 39.0 - 52.0 %   Hemoglobin 12.9 (L) 13.0 - 17.0 g/dL   Patient temperature 98.0 F    Sample type ARTERIAL    Comment NOTIFIED PHYSICIAN   Lactic acid, plasma     Status: None   Collection Time: 03/21/19  6:22 PM  Result Value Ref Range  Lactic Acid, Venous 1.5 0.5 - 1.9 mmol/L    Comment: Performed at [facility], [place] [address], [place], [place] [address]   Ct Code Stroke Cta Head W/wo Contrast  Result Date: 03/21/2019 CLINICAL DATA:  Altered mental status.  Aphasia. EXAM: CT ANGIOGRAPHY HEAD AND NECK CT PERFUSION BRAIN TECHNIQUE: Multidetector CT imaging of the head and neck was performed using the standard  protocol during bolus administration of intravenous contrast. Multiplanar CT image reconstructions and MIPs were obtained to evaluate the vascular anatomy. Carotid stenosis measurements (when applicable) are obtained utilizing NASCET criteria, using the distal internal carotid diameter as the denominator. Multiphase CT imaging of the brain was performed following IV bolus contrast injection. Subsequent parametric perfusion maps were calculated using RAPID software. CONTRAST:  116m OMNIPAQUE IOHEXOL 350 MG/ML SOLN COMPARISON:  Head CT earlier same day FINDINGS: CTA NECK FINDINGS Aortic arch: Aortic atherosclerosis. No aneurysm or dissection. Branching pattern is normal without origin stenosis. Right carotid system: Common carotid artery widely patent to the bifurcation. Calcified plaque at the carotid bifurcation and ICA bulb. No stenosis when compared to the more distal cervical ICA diameter. Some plaque affecting the distal cervical ICA but no stenosis. Left carotid system: Common carotid artery widely patent to the bifurcation. Calcified plaque at the carotid bifurcation and ICA bulb but no stenosis. Distal cervical ICA shows some plaque but no stenosis. Vertebral arteries: Both vertebral artery origins are widely patent. Both vertebral arteries appear approximately equal in size an widely patent through the cervical region to the foramen magnum. Skeleton: Minimal cervical spondylosis. Other neck: No mass or lymphadenopathy. Upper chest: Emphysema and pulmonary scarring.  No focal lesion. Review of the MIP images confirms the above findings CTA HEAD FINDINGS Anterior circulation: Both internal carotid arteries are patent through the skull base and siphon regions. Atherosclerotic calcification within the carotid siphon regions. Stenosis estimated at 50% on each side. The anterior and middle cerebral vessels are patent without proximal stenosis, aneurysm vascular malformation. No large or medium vessel occlusion is  identified. Primary anterior circulation supply to both posterior cerebral arteries. Posterior circulation: Both vertebral arteries are patent through the foramen magnum to the basilar. Basilar artery is small but does not show focal stenosis. Both superior cerebellar arteries show flow. As noted above, both posterior cerebral arteries receive the majority of there supply from the anterior circulation. Venous sinuses: Patent and normal. Anatomic variants: None significant otherwise. Review of the MIP images confirms the above findings CT Brain Perfusion Findings: ASPECTS: 10 CBF (<30%) Volume: 01mPerfusion (Tmax>6.0s) volume: 77m15mismatch Volume: 77mL78mfarction Location:None present IMPRESSION: Normal CT perfusion study. Atherosclerotic change at both carotid bifurcations but no stenosis. Atherosclerotic change in the carotid siphon regions without stenosis greater than 50%. No intracranial large or medium vessel occlusion or correctable proximal stenosis. Aortic atherosclerosis. These results were communicated to Dr. ArorRory Percy12:59 pmon 12/3/2020by text page via the AM[facility]aging system. Electronically Signed   By: MarkNelson Chimes.   On: 03/21/2019 13:01   Ct Head Wo Contrast  Result Date: 03/21/2019 CLINICAL DATA:  Altered mental status and aphasia. EXAM: CT HEAD WITHOUT CONTRAST TECHNIQUE: Contiguous axial images were obtained from the base of the skull through the vertex without intravenous contrast. COMPARISON:  None. FINDINGS: Brain: No evidence of acute infarction, hemorrhage, hydrocephalus, extra-axial collection or mass lesion/mass effect. 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 There is mild diffuse low-attenuation within the subcortical and periventricular white matter compatible with chronic microvascular disease. Vascular:  No hyperdense vessel or unexpected calcification. Skull: Normal. Negative for fracture or focal lesion. Sinuses/Orbits: Paranasal sinuses are clear. Mastoid  air cells are clear. Post op changes involving both orbits noted. Other: None IMPRESSION: 1. No acute intracranial abnormalities. 2. Chronic small vessel ischemic change . Electronically Signed   By: Kerby Moors M.D.   On: 03/21/2019 12:08   Ct Code Stroke Cta Neck W/wo Contrast  Result Date: 03/21/2019 CLINICAL DATA:  Altered mental status.  Aphasia. EXAM: CT ANGIOGRAPHY HEAD AND NECK CT PERFUSION BRAIN TECHNIQUE: Multidetector CT imaging of the head and neck was performed using the standard protocol during bolus administration of intravenous contrast. Multiplanar CT image reconstructions and MIPs were obtained to evaluate the vascular anatomy. Carotid stenosis measurements (when applicable) are obtained utilizing NASCET criteria, using the distal internal carotid diameter as the denominator. Multiphase CT imaging of the brain was performed following IV bolus contrast injection. Subsequent parametric perfusion maps were calculated using RAPID software. CONTRAST:  132m OMNIPAQUE IOHEXOL 350 MG/ML SOLN COMPARISON:  Head CT earlier same day FINDINGS: CTA NECK FINDINGS Aortic arch: Aortic atherosclerosis. No aneurysm or dissection. Branching pattern is normal without origin stenosis. Right carotid system: Common carotid artery widely patent to the bifurcation. Calcified plaque at the carotid bifurcation and ICA bulb. No stenosis when compared to the more distal cervical ICA diameter. Some plaque affecting the distal cervical ICA but no stenosis. Left carotid system: Common carotid artery widely patent to the bifurcation. Calcified plaque at the carotid bifurcation and ICA bulb but no stenosis. Distal cervical ICA shows some plaque but no stenosis. Vertebral arteries: Both vertebral artery origins are widely patent. Both vertebral arteries appear approximately equal in size an widely patent through the cervical region to the foramen magnum. Skeleton: Minimal cervical spondylosis. Other neck: No mass or  lymphadenopathy. Upper chest: Emphysema and pulmonary scarring.  No focal lesion. Review of the MIP images confirms the above findings CTA HEAD FINDINGS Anterior circulation: Both internal carotid arteries are patent through the skull base and siphon regions. Atherosclerotic calcification within the carotid siphon regions. Stenosis estimated at 50% on each side. The anterior and middle cerebral vessels are patent without proximal stenosis, aneurysm vascular malformation. No large or medium vessel occlusion is identified. Primary anterior circulation supply to both posterior cerebral arteries. Posterior circulation: Both vertebral arteries are patent through the foramen magnum to the basilar. Basilar artery is small but does not show focal stenosis. Both superior cerebellar arteries show flow. As noted above, both posterior cerebral arteries receive the majority of there supply from the anterior circulation. Venous sinuses: Patent and normal. Anatomic variants: None significant otherwise. Review of the MIP images confirms the above findings CT Brain Perfusion Findings: ASPECTS: 10 CBF (<30%) Volume: 019mPerfusion (Tmax>6.0s) volume: 65m36mismatch Volume: 65mL66mfarction Location:None present IMPRESSION: Normal CT perfusion study. Atherosclerotic change at both carotid bifurcations but no stenosis. Atherosclerotic change in the carotid siphon regions without stenosis greater than 50%. No intracranial large or medium vessel occlusion or correctable proximal stenosis. Aortic atherosclerosis. These results were communicated to Dr. ArorRory Percy12:59 pmon 12/3/2020by text page via the AM[facility]aging system. Electronically Signed   By: MarkNelson Chimes.   On: 03/21/2019 13:01   Ct Code Stroke Cerebral Perfusion With Contrast  Result Date: 03/21/2019 CLINICAL DATA:  Altered mental status.  Aphasia. EXAM: CT ANGIOGRAPHY HEAD AND NECK CT PERFUSION BRAIN TECHNIQUE: Multidetector CT imaging of the head and neck was performed  using the standard protocol during bolus administration  of intravenous contrast. Multiplanar CT image reconstructions and MIPs were obtained to evaluate the vascular anatomy. Carotid stenosis measurements (when applicable) are obtained utilizing NASCET criteria, using the distal internal carotid diameter as the denominator. Multiphase CT imaging of the brain was performed following IV bolus contrast injection. Subsequent parametric perfusion maps were calculated using RAPID software. CONTRAST:  134m OMNIPAQUE IOHEXOL 350 MG/ML SOLN COMPARISON:  Head CT earlier same day FINDINGS: CTA NECK FINDINGS Aortic arch: Aortic atherosclerosis. No aneurysm or dissection. Branching pattern is normal without origin stenosis. Right carotid system: Common carotid artery widely patent to the bifurcation. Calcified plaque at the carotid bifurcation and ICA bulb. No stenosis when compared to the more distal cervical ICA diameter. Some plaque affecting the distal cervical ICA but no stenosis. Left carotid system: Common carotid artery widely patent to the bifurcation. Calcified plaque at the carotid bifurcation and ICA bulb but no stenosis. Distal cervical ICA shows some plaque but no stenosis. Vertebral arteries: Both vertebral artery origins are widely patent. Both vertebral arteries appear approximately equal in size an widely patent through the cervical region to the foramen magnum. Skeleton: Minimal cervical spondylosis. Other neck: No mass or lymphadenopathy. Upper chest: Emphysema and pulmonary scarring.  No focal lesion. Review of the MIP images confirms the above findings CTA HEAD FINDINGS Anterior circulation: Both internal carotid arteries are patent through the skull base and siphon regions. Atherosclerotic calcification within the carotid siphon regions. Stenosis estimated at 50% on each side. The anterior and middle cerebral vessels are patent without proximal stenosis, aneurysm vascular malformation. No large or medium  vessel occlusion is identified. Primary anterior circulation supply to both posterior cerebral arteries. Posterior circulation: Both vertebral arteries are patent through the foramen magnum to the basilar. Basilar artery is small but does not show focal stenosis. Both superior cerebellar arteries show flow. As noted above, both posterior cerebral arteries receive the majority of there supply from the anterior circulation. Venous sinuses: Patent and normal. Anatomic variants: None significant otherwise. Review of the MIP images confirms the above findings CT Brain Perfusion Findings: ASPECTS: 10 CBF (<30%) Volume: 057mPerfusion (Tmax>6.0s) volume: 69m54mismatch Volume: 69mL85mfarction Location:None present IMPRESSION: Normal CT perfusion study. Atherosclerotic change at both carotid bifurcations but no stenosis. Atherosclerotic change in the carotid siphon regions without stenosis greater than 50%. No intracranial large or medium vessel occlusion or correctable proximal stenosis. Aortic atherosclerosis. These results were communicated to Dr. ArorRory Percy12:59 pmon 12/3/2020by text page via the [facility] system. Electronically Signed   By: MarkNelson Chimes.   On: 03/21/2019 13:01   Dg Chest Portable 1 View  Result Date: 03/21/2019 CLINICAL DATA:  Shortness of breath. EXAM: PORTABLE CHEST 1 VIEW COMPARISON:  March 17, 2019. FINDINGS: Stable cardiomediastinal silhouette. No pneumothorax or pleural effusion is noted. Both lungs are clear. The visualized skeletal structures are unremarkable. IMPRESSION: No active disease. Electronically Signed   By: JameMarijo Conception.   On: 03/21/2019 19:06    Pending Labs Unresulted Labs (From admission, onward)    Start     Ordered   03/22/19 0500  CBC  Tomorrow morning,   R     03/21/19 1432   03/22/19 0500  Comprehensive metabolic panel  Tomorrow morning,   R     03/21/19 1432   03/21/19 1436  Culture, blood (routine x 2)  BLOOD CULTURE X 2,   R (with STAT  occurrences)     03/21/19 1435   03/21/19 1226  Blood  gas, arterial  Once,   R     03/21/19 1225          Vitals/Pain Today's Vitals   03/21/19 1700 03/21/19 1739 03/21/19 1830 03/21/19 1931  BP: (!) 171/90 (!) 166/82 (!) 149/69   Pulse:  86 79   Resp:  14 16   Temp:      TempSrc:      SpO2:  97% 98%   PainSc:    0-No pain    Isolation Precautions No active isolations  Medications Medications  sodium chloride 0.9 % bolus 500 mL (0 mLs Intravenous Stopped 03/21/19 1454)    Followed by  0.9 %  sodium chloride infusion (100 mL/hr Intravenous New Bag/Given 03/21/19 1457)  heparin injection 5,000 Units (has no administration in time range)  sodium chloride flush (NS) 0.9 % injection 3 mL (3 mLs Intravenous Not Given 03/21/19 1458)  ondansetron (ZOFRAN) tablet 4 mg (has no administration in time range)    Or  ondansetron (ZOFRAN) injection 4 mg (has no administration in time range)  ipratropium-albuterol (DUONEB) 0.5-2.5 (3) MG/3ML nebulizer solution 3 mL (has no administration in time range)  iohexol (OMNIPAQUE) 350 MG/ML injection 100 mL (100 mLs Intravenous Contrast Given 03/21/19 1221)  insulin aspart (novoLOG) injection 5 Units (5 Units Intravenous Given 03/21/19 1451)    And  dextrose 50 % solution 50 mL (50 mLs Intravenous Given 03/21/19 1451)  sodium bicarbonate injection 50 mEq (50 mEq Intravenous Given 03/21/19 1454)    Mobility      Focused Assessments Neuro Assessment Handoff:     NIH Stroke Scale ( + Modified Stroke Scale Criteria)  Interval: Initial Level of Consciousness (1a.)   : Not alert, but arousable by minor stimulation to obey, answer, or respond LOC Questions (1b. )   +: Answers neither question correctly LOC Commands (1c. )   + : Performs neither task correctly Best Gaze (2. )  +: Normal Visual (3. )  +: No visual loss Facial Palsy (4. )    : Normal symmetrical movements Motor Arm, Left (5a. )   +: Some effort against gravity Motor Arm, Right  (5b. )   +: Some effort against gravity Motor Leg, Left (6a. )   +: No effort against gravity Motor Leg, Right (6b. )   +: No effort against gravity Limb Ataxia (7. ): Absent Sensory (8. )   +: Normal, no sensory loss Best Language (9. )   +: Severe aphasia Dysarthria (10. ): Mild-to-moderate dysarthria, patient slurs at least some words and, at worst, can be understood with some difficulty Extinction/Inattention (11.)   +: No Abnormality Modified SS Total  +: 16 Complete NIHSS TOTAL: 18 Last date known well: 03/20/19 Last time known well: 2330 Neuro Assessment: Exceptions to WDL Neuro Checks:   Initial (03/21/19 1220)  Last Documented NIHSS Modified Score: 16 (03/21/19 1220)  If patient is a Neuro Trauma and patient is going to OR before floor call report to [place] nurse: [phone] or [phone]     R Recommendations: See Admitting Provider Note  Report given to:   Additional Notes:

## 2019-03-21 NOTE — Code Documentation (Signed)
78yo male arriving to [facility] via [place] at 82. Patient from home where he was LKW at 2330 last night. Patient with altered mental status and questionable aphasia on arrival and code stroke activated. Stroke team to the bedside in CT. Noncontrast CT completed prior. CTA and CTP completed. NIHSS 18, see documentation for details and code stroke times. Patient with largely nonfocal exam. Patient with difficulty following commands, naming and answering questions on exam. Patient is outside the window for treatment with IV tPA. CTA negative for LVO per Dr. Rory Percy. Code stroke canceled. Bedside handoff with ED RN [place].

## 2019-03-21 NOTE — ED Notes (Signed)
Activated code stroke @ 12:07

## 2019-03-21 NOTE — Consult Note (Addendum)
Neurology Consultation  Reason for Consult: Code stroke Referring Physician: Vanita Panda  CC: Altered mental status  History is obtained from: EMS  HPI: Phillip Nelles is a 78 y.o. 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  Patient was last seen normal at approximately of 2330 last night when he went to bed.  Family found him in the a.m. due to being altered. EMS was called.  On EMS arrival patient was agitated and apparently fighting EMS staff when attempting to move his legs and arms for assessment.  In route it appeared to EMS he was having difficulty comprehending his words and he was answering "yes" to questions.  He was evaluated in the emergency room.  The ER doctor called neuro hospitalist on call about patient being outside the TPA window but being aphasic.  Code stroke activation was advised by the neuro hospitalist.. On arrival patient was very obtunded but with sternal rub and noxious stimuli to the trapezius he would wake up and answer some questions.  In discussing with wife, she states that went to bed at 2330 hrs.  At that time he was fine.  At baseline patient is able to take care of himself, does not drive, and recently started using a cane secondary to weakness in his legs.  At approximately 1:30 AM she awoke to go to the bathroom and noted he did not have his oxygen on.  At the time she placed his oxygen back on and asked him questions.  She noted he was only moaning when asked questions.  She stayed up for the whole night and watched him.  At 11:30 in the morning she called her daughter who came over and noted he was moaning and not himself.  For that reason they called EMS.  EMS stated his oxygen level was within normal limits however due to his  altered mental status he was brought to [facility].   ED course:  Stat neurology consultation, CT head, CTA head and neck, CT perfusion, basic labs  LKW: 2330 on 03/20/2019 tpa given?: no, out of window Premorbid modified Rankin scale (mRS): 3 NIH Stroke Scale 18    Past Medical History:  Diagnosis Date  . Cancer ([place])   . Chronic respiratory failure with hypoxia, on home O2 therapy ([place])   . COPD (chronic obstructive pulmonary disease) ([place])   . Emphysema (subcutaneous) (surgical) resulting from a procedure   . Essential hypertension   . GERD (gastroesophageal reflux disease)   . Hyperglycemia   . Hyperlipidemia   . Multiple lung nodules on CT   . Obstructive sleep apnea on CPAP   . Oxygen dependent   . Renal insufficiency bilateral   . Sensorineural hearing loss     Family History  Problem Relation Age of Onset  . Hypertension Mother   . Hypertension Father    Social History:   reports previous alcohol use. He reports that he does not use drugs. No history on file for tobacco.  Medications  Current Facility-Administered Medications:  .  [COMPLETED] sodium chloride 0.9 % bolus 500 mL, 500 mL, Intravenous, Once, Last Rate: 500 mL/hr at 03/21/19 1255, 500 mL at 03/21/19 1255 **FOLLOWED BY** 0.9 %  sodium chloride infusion, 100 mL/hr, Intravenous, Continuous, Carmin Muskrat, MD No current outpatient medications on file.  Home medications  Amlodipine Oygen  Trelegy nightly CPAP Lovastatin  Exam: Current vital signs: BP (!) 148/76 (BP Location: Right Arm)   Pulse 89   Temp 98.8 F (37.1 C) (Oral)   Resp 19   SpO2 95%  Vital signs in last 24 hours: Temp:  [98.8 F (37.1 C)] 98.8 F (37.1 C) (12/03 1208) Pulse Rate:  [89-107] 89 (12/03 1309) Resp:  [19-23] 19 (12/03 1309) BP: (148)/(76) 148/76 (12/03 1208) SpO2:  [95 %-100 %] 95 % (12/03 1309)  ROS:  Unable to obtain due to altered mental status/encephalopathy   Physical Exam   Constitutional:  Appears well-developed and well-nourished.  Psych: Altered mental status Eyes: No scleral injection HENT: No OP obstrucion Head: Normocephalic.  Cardiovascular: Normal rate and regular rhythm.  Respiratory: Effort normal, non-labored breathing GI: Soft.  No distension.   Skin: WDI  Neuro: Mental Status: Patient does not follow commands, clenching eyes closed, when asked questions moans.  With significant noxious stimuli at one point he did state his name. Cranial Nerves: II: Visual Fields are full.  III,IV, VI: EOMI without ptosis or diploplia.  Did not obtain pupils as patient was clenching his eyes tight V: Facial sensation is symmetric to temperature VII: Facial movement is symmetric.  VIII: hearing is intact to voice X: Unable to assess XI:  Unable to assess XII:   Unable to assess Motor: Tone is normal. Bulk is normal.  Left arm has less ROM - wife says that is due to dislocated shoulder.  Seems to be strong in the right arm and both legs but is unable to follow commands and arms and legs fall to the bed before 10 and 5 seconds respectively. Sensory: Responds with grimace and withdrawal as above to noxious stimuli in all 4 extremities Deep Tendon Reflexes: No Achilles reflex however he does have 2+ knee reflex and 2+ brachioradialis Plantars: Toes are downgoing bilaterally.  Cerebellar: Unable to obtain   Labs I have reviewed labs in epic and the results pertinent to this consultation are:   CBC    Component Value Date/Time   WBC 12.5 (H) 03/21/2019 1215   RBC 4.68 03/21/2019 1215   HGB 14.6 03/21/2019 1216   HCT 43.0 03/21/2019 1216   PLT 219 03/21/2019 1215   MCV 91.5 03/21/2019 1215   MCH 27.1 03/21/2019 1215   MCHC 29.7 (L) 03/21/2019 1215   RDW 15.7 (H) 03/21/2019 1215   LYMPHSABS 0.5 (L) 03/21/2019 1215   MONOABS 0.8 03/21/2019 1215   EOSABS 0.0 03/21/2019 1215   BASOSABS 0.0 03/21/2019 1215    CMP     Component Value Date/Time   NA 136 03/21/2019  1216   K 6.4 (HH) 03/21/2019 1216   CL 97 (L) 03/21/2019 1216   GLUCOSE 153 (H) 03/21/2019 1216   BUN 38 (H) 03/21/2019 1216   CREATININE 2.30 (H) 03/21/2019 1216   Imaging I have reviewed the images obtained: CT-scan of the brain-no acute intracranial abnormalities, chronic small vessel ischemic changes CT angio of head and neck-atherosclerotic changes at both carotid bifurcations but no stenosis, atherosclerotic change in the carotid siphon regions without stenosis greater than 50%, no intracranial large or medium vessel occlusion or correctable proximal stenosis, aortic atherosclerosis CT perfusion of brain-normal CT perfusion   Etta Quill PA-C [facility] [phone]  M-F  (9:00 am- 5:00 PM)  03/21/2019, 1:11 PM     Attending addendum Patient seen and examined as an acute code stroke for altered mental status and aphasia. Upon obtaining more history, has been  confused since waking up.  Question of being hypoxemic for prolonged time because of not using oxygen. Examination with severe encephalopathy but nonfocal for any weakness-with the exception of left arm which has chronic weakness from dislocation. CT CTA head and neck and CT perfusion study unremarkable for acute process.  Imaging reviewed independently. NIH stroke scale 18-bilateral involvement. Not a candidate for IV TPA due to being outside the window and symptoms being consistent more with toxic metabolic encephalopathy and not stroke. Not a candidate for EVT due to no LVO.  Assessment:  This is a 78 year old male who presented for evaluation of altered mental status.   He has past medical history significant for essential hypertension, chronic respiratory failure with hypoxia requiring home oxygen, COPD, obstructive sleep apnea on home CPAP, emphysema, GERD.   On exam patient is extremely encephalopathic but showing no localizing lateralizing deficits.  He is moving his left arm less than the right arm  however, this is due to to the fact left shoulder is dislocated according to his wife.  CT, CTA of head and neck/perfusion was obtained and did not show any large vessel occlusion and or perfusion abnormalities.  At this time I believe his altered mental status is likely secondary to multifactorial toxic metabolic encephalopathy secondary to prolonged hypoxemia and deranged renal function.  Also might have some amount of hypoxic ischemic encephalopathy.  Stroke less likely but cannot be ruled out until an MRI is obtained.  Impression: -Multifactorial toxic metabolic encephalopathy-hypoxia, hypercapnia, uremia -Evaluate for hypoxic ischemic encephalopathy -Evaluate for stroke-less likely-outside the window for TPA and not a candidate for intervention due to no LVO. -Consider evaluating for seizures if mentation does not improve with correction of the above  Recommendations: -Stat ABG recommended.  Is hypoxic and hypercapnic, slightly acidotic.  Management of hypoxia per primary team. -Check ammonia -Correction of renal function per primary team -TSH, B12 -Urinalysis, chest x-ray -Urine drug screen  -Consider EEG if mentation does not improve upon correction of the toxic metabolic derangements -MRI brain without contrast  Plan relayed to Dr. Vanita Panda. If the MRI of the brain or EEG show anything abnormal, we will provide further recommendations.  Please call neurology with questions  -- Amie Portland, MD [facility] Pager: [phone] If 7pm to 7am, please call on call as listed on AMION.  CRITICAL CARE ATTESTATION Performed by: Amie Portland, MD Total critical care time: 51 minutes Critical care time was exclusive of separately billable procedures and treating other patients and/or supervising APPs/Residents/Students Critical care was necessary to treat or prevent imminent or life-threatening deterioration due to strokelike symptoms, toxic metabolic encephalopathy This  patient is critically ill and at significant risk for neurological worsening and/or death and care requires constant monitoring. Critical care was time spent personally by me on the following activities: development of treatment plan with patient and/or surrogate as well as nursing, discussions with consultants, evaluation of patient's response to treatment, examination of patient, obtaining history from patient or surrogate, ordering and performing treatments and interventions, ordering and review of laboratory studies, ordering and review of radiographic studies, pulse oximetry, re-evaluation of patient's condition, participation in multidisciplinary rounds and medical decision making of high complexity in the care of this patient.

## 2019-03-21 NOTE — Progress Notes (Signed)
Critical ABG results given to RN.  

## 2019-03-21 NOTE — Progress Notes (Signed)
EEG complete - results pending 

## 2019-03-21 NOTE — Progress Notes (Signed)
Patient arrived to unit on 4L O2 via nasal cannula. VSS. Cardiac monitoring initiated and verified. Will continue to monitor throughout the shift.

## 2019-03-21 NOTE — ED Notes (Addendum)
Family at bedside. Pt's watch given to family to take home.

## 2019-03-21 NOTE — ED Notes (Signed)
Pt taken off BiPap. Placed on 4L Nasal Cannula (baseline). SpO2 remains 94-95%.

## 2019-03-21 NOTE — ED Notes (Signed)
RT called for Arterial Blood Gas

## 2019-03-21 NOTE — Progress Notes (Signed)
ABG results given to Dr. Vanita Panda.  increased for 3L to 6L.

## 2019-03-22 ENCOUNTER — Inpatient Hospital Stay (HOSPITAL_COMMUNITY): Payer: Medicare HMO

## 2019-03-22 ENCOUNTER — Other Ambulatory Visit: Payer: Self-pay

## 2019-03-22 DIAGNOSIS — I361 Nonrheumatic tricuspid (valve) insufficiency: Secondary | ICD-10-CM

## 2019-03-22 DIAGNOSIS — I340 Nonrheumatic mitral (valve) insufficiency: Secondary | ICD-10-CM

## 2019-03-22 DIAGNOSIS — I6340 Cerebral infarction due to embolism of unspecified cerebral artery: Secondary | ICD-10-CM | POA: Insufficient documentation

## 2019-03-22 LAB — GLUCOSE, CAPILLARY: Glucose-Capillary: 101 mg/dL — ABNORMAL HIGH (ref 70–99)

## 2019-03-22 LAB — RESPIRATORY PANEL BY PCR

## 2019-03-22 LAB — CBC
HCT: 36 % — ABNORMAL LOW (ref 39.0–52.0)
Hemoglobin: 10.9 g/dL — ABNORMAL LOW (ref 13.0–17.0)
MCH: 27 pg (ref 26.0–34.0)
MCHC: 30.3 g/dL (ref 30.0–36.0)
MCV: 89.3 fL (ref 80.0–100.0)
Platelets: 234 10*3/uL (ref 150–400)
RBC: 4.03 MIL/uL — ABNORMAL LOW (ref 4.22–5.81)
RDW: 15.6 % — ABNORMAL HIGH (ref 11.5–15.5)
WBC: 8.5 10*3/uL (ref 4.0–10.5)
nRBC: 0.4 % — ABNORMAL HIGH (ref 0.0–0.2)

## 2019-03-22 LAB — LIPID PANEL
Cholesterol: 125 mg/dL (ref 0–200)
HDL: 34 mg/dL — ABNORMAL LOW (ref >40)
LDL Cholesterol: 73 mg/dL (ref 0–99)
Total CHOL/HDL Ratio: 3.7 RATIO
Triglycerides: 89 mg/dL (ref <150)
VLDL: 18 mg/dL (ref 0–40)

## 2019-03-22 LAB — COMPREHENSIVE METABOLIC PANEL
ALT: 865 U/L — ABNORMAL HIGH (ref 0–44)
AST: 1211 U/L — ABNORMAL HIGH (ref 15–41)
Albumin: 2.7 g/dL — ABNORMAL LOW (ref 3.5–5.0)
Alkaline Phosphatase: 77 U/L (ref 38–126)
Anion gap: 9 (ref 5–15)
BUN: 24 mg/dL — ABNORMAL HIGH (ref 8–23)
CO2: 38 mmol/L — ABNORMAL HIGH (ref 22–32)
Calcium: 8.9 mg/dL (ref 8.9–10.3)
Chloride: 95 mmol/L — ABNORMAL LOW (ref 98–111)
Creatinine, Ser: 1.5 mg/dL — ABNORMAL HIGH (ref 0.61–1.24)
GFR calc Af Amer: 51 mL/min — ABNORMAL LOW (ref >60)
GFR calc non Af Amer: 44 mL/min — ABNORMAL LOW (ref >60)
Glucose, Bld: 89 mg/dL (ref 70–99)
Potassium: 4.5 mmol/L (ref 3.5–5.1)
Sodium: 142 mmol/L (ref 135–145)
Total Bilirubin: 0.9 mg/dL (ref 0.3–1.2)
Total Protein: 6.8 g/dL (ref 6.5–8.1)

## 2019-03-22 LAB — ECHOCARDIOGRAM COMPLETE
Height: 72 in
Weight: 3559.11 oz

## 2019-03-22 LAB — HEMOGLOBIN A1C
Hgb A1c MFr Bld: 7.4 % — ABNORMAL HIGH (ref 4.8–5.6)
Mean Plasma Glucose: 165.68 mg/dL

## 2019-03-22 MED ORDER — ASPIRIN EC 81 MG PO TBEC
81.0000 mg | DELAYED_RELEASE_TABLET | Freq: Every day | ORAL | Status: DC
  Administered 2019-03-23 – 2019-03-29 (×7): 81 mg via ORAL
  Filled 2019-03-22 (×7): qty 1

## 2019-03-22 MED ORDER — FERROUS SULFATE 325 (65 FE) MG PO TABS
325.0000 mg | ORAL_TABLET | Freq: Every day | ORAL | Status: DC
  Administered 2019-03-23 – 2019-03-29 (×8): 325 mg via ORAL
  Filled 2019-03-22 (×9): qty 1

## 2019-03-22 MED ORDER — PRO-STAT SUGAR FREE PO LIQD
30.0000 mL | Freq: Two times a day (BID) | ORAL | Status: DC
  Administered 2019-03-22 – 2019-03-25 (×6): 30 mL
  Filled 2019-03-22 (×6): qty 30

## 2019-03-22 MED ORDER — SODIUM CHLORIDE 0.9 % IV SOLN
INTRAVENOUS | Status: DC
  Administered 2019-03-22 – 2019-03-23 (×2): via INTRAVENOUS

## 2019-03-22 MED ORDER — Medication
Status: DC
Start: ? — End: 2019-03-22

## 2019-03-22 MED ORDER — RESOURCE THICKENUP CLEAR PO POWD
ORAL | Status: DC | PRN
  Filled 2019-03-22: qty 125

## 2019-03-22 MED ORDER — FLUTICASONE-UMECLIDIN-VILANT 100-62.5-25 MCG/INH IN AEPB: 1.0000 | INHALATION_SPRAY | Freq: Every day | RESPIRATORY_TRACT | Status: DC

## 2019-03-22 MED ORDER — CLOPIDOGREL BISULFATE 75 MG PO TABS
75.0000 mg | ORAL_TABLET | Freq: Every day | ORAL | Status: DC
  Administered 2019-03-22 – 2019-03-29 (×8): 75 mg via ORAL
  Filled 2019-03-22 (×8): qty 1

## 2019-03-22 MED ORDER — ATORVASTATIN CALCIUM 10 MG PO TABS: 20.0000 mg | ORAL_TABLET | Freq: Every day | ORAL | Status: DC

## 2019-03-22 MED ORDER — METOPROLOL TARTRATE 25 MG PO TABS
25.0000 mg | ORAL_TABLET | Freq: Every day | ORAL | Status: DC
  Administered 2019-03-22 – 2019-03-29 (×8): 25 mg via ORAL
  Filled 2019-03-22 (×8): qty 1

## 2019-03-22 MED ORDER — BRIMONIDINE TARTRATE 0.2 % OP SOLN
1.0000 [drp] | Freq: Two times a day (BID) | OPHTHALMIC | Status: DC
  Administered 2019-03-22 – 2019-03-29 (×15): 1 [drp] via OPHTHALMIC
  Filled 2019-03-22: qty 5

## 2019-03-22 MED ORDER — DORZOLAMIDE HCL 2 % OP SOLN
1.0000 [drp] | Freq: Two times a day (BID) | OPHTHALMIC | Status: DC
  Administered 2019-03-22 – 2019-03-29 (×15): 1 [drp] via OPHTHALMIC
  Filled 2019-03-22: qty 10

## 2019-03-22 MED ORDER — ASPIRIN 300 MG RE SUPP
300.0000 mg | Freq: Every day | RECTAL | Status: DC
  Filled 2019-03-22: qty 1

## 2019-03-22 MED ORDER — UMECLIDINIUM BROMIDE 62.5 MCG/INH IN AEPB
1.0000 | INHALATION_SPRAY | Freq: Every day | RESPIRATORY_TRACT | Status: DC
  Administered 2019-03-22 – 2019-03-29 (×8): 1 via RESPIRATORY_TRACT
  Filled 2019-03-22 (×2): qty 7

## 2019-03-22 MED ORDER — PANTOPRAZOLE SODIUM 40 MG PO TBEC
40.0000 mg | DELAYED_RELEASE_TABLET | Freq: Every day | ORAL | Status: DC
  Administered 2019-03-22 – 2019-03-29 (×8): 40 mg via ORAL
  Filled 2019-03-22 (×8): qty 1

## 2019-03-22 MED ORDER — ALLOPURINOL 300 MG PO TABS
300.0000 mg | ORAL_TABLET | Freq: Every day | ORAL | Status: DC
  Administered 2019-03-22 – 2019-03-29 (×8): 300 mg via ORAL
  Filled 2019-03-22 (×8): qty 1

## 2019-03-22 MED ORDER — ASPIRIN EC 325 MG PO TBEC
325.0000 mg | DELAYED_RELEASE_TABLET | Freq: Every day | ORAL | Status: DC
  Administered 2019-03-22: 325 mg via ORAL
  Filled 2019-03-22: qty 1

## 2019-03-22 MED ORDER — OSMOLITE 1.5 CAL PO LIQD
1000.0000 mL | ORAL | Status: DC
  Administered 2019-03-22 – 2019-03-25 (×3): 1000 mL
  Filled 2019-03-22 (×6): qty 1000

## 2019-03-22 MED ORDER — ATORVASTATIN CALCIUM 40 MG PO TABS
40.0000 mg | ORAL_TABLET | Freq: Every day | ORAL | Status: DC
  Administered 2019-03-22 – 2019-03-28 (×7): 40 mg via ORAL
  Filled 2019-03-22 (×8): qty 1

## 2019-03-22 MED ORDER — JEVITY 1.2 CAL PO LIQD: 1000.0000 mL | ORAL | Status: DC

## 2019-03-22 MED ORDER — FLUTICASONE FUROATE-VILANTEROL 100-25 MCG/INH IN AEPB
1.0000 | INHALATION_SPRAY | Freq: Every day | RESPIRATORY_TRACT | Status: DC
  Administered 2019-03-22 – 2019-03-29 (×8): 1 via RESPIRATORY_TRACT
  Filled 2019-03-22: qty 28

## 2019-03-22 NOTE — Progress Notes (Signed)
Initial Nutrition Assessment  RD working remotely.  DOCUMENTATION CODES:   Obesity unspecified  INTERVENTION:   Initiate Osmolite 1.5 @ 25 ml/hr via cortrak tube and increase by 10 ml every 4 hours to goal rate of 55 ml/hr.   30 ml Prostat BID.    Tube feeding regimen provides 2180 kcal (100% of needs), 113 grams of protein, and 1006 ml of H2O.   NUTRITION DIAGNOSIS:   Inadequate oral intake related to inability to eat as evidenced by NPO status.  GOAL:   Patient will meet greater than or equal to 90% of their needs  MONITOR:   Diet advancement, Labs, Weight trends, Skin, I & O's  REASON FOR ASSESSMENT:   Other (Comment)    ASSESSMENT:   Phillip Owens is a 78 y.o. 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  Patient was last seen normal at approximately of 2330 last night when he went to bed.  Family found him in the a.m. due to being altered. EMS was called.  On EMS arrival patient was agitated and apparently fighting EMS staff when attempting to move his legs and arms for assessment.  In route it appeared to EMS he was having difficulty comprehending his words and he was answering "yes" to questions.  He was evaluated in the emergency room.  The ER doctor called neuro hospitalist on call about patient being outside the TPA window but being aphasic.  Code stroke activation was advised by the neuro hospitalist..  Pt admitted with acute metabolic encephalopathy, aphasia.   12/4- s/p BSE- advanced to dysphagia 3 diet with thin liquids; s/p MBSS- recommend NPO via alternative means secondary to severe dysphagia; cortrak tube placed- tip of tube confirmed in stomach  Per MD notes, CT scan of brain and head and neck did not show evidence of  of stroke or significant large vessel occlusion. 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   Noted poor oral intake during hospitalization; PO 0-30%.   Spoke with pt via phone, who reports feeling well today. He had a good appetite PTA and generally consumes 2 meals per day (usually chicken or bologna sandwiches); he always skips breakfast as he does not wake up until 1 PM on most days. Pt reports that he has always had some issues with swallowing ("food would get stuck and come back up"). Pt would not give further details despite RD probing, but states that this started happening more frequently prior to admission.   Pt shares that UBW is around 235#. He is unsure is he has lost weight, but suspect "I may have lost a little, but I was trying". He denies any changes in his functional status.   Discussed rationale for NPO order and plan to use cortrak tube for nutrition until pt able to safely swallow (discussed with cortrak RD, who placed tube just prior to RD calling pt). Pt skeptical about need for cortrak tube, however, amenable to continue with tube and start feedings.  Case discussed with Dr. Verlon Au, who gave this RD verbal permission to start TF.   Medications reviewed and include sodium chloride infusion @ 50 ml/hr.   Labs reviewed.   Diet Order:   Diet Order            Diet NPO time specified  Diet effective  now              EDUCATION NEEDS:   Not appropriate for education at this time  Skin:  Skin Assessment: Reviewed RN Assessment  Last BM:  03/21/19  Height:   Ht Readings from Last 1 Encounters:  03/21/19 6' (1.829 m)    Weight:   Wt Readings from Last 1 Encounters:  03/22/19 100.9 kg    Ideal Body Weight:  80.9 kg  BMI:  Body mass index is 30.17 kg/m.  Estimated Nutritional Needs:   Kcal:  2000-2200  Protein:  105-120 grams  Fluid:  > 2 L    Tierra Thoma A. Jimmye Norman, RD, LDN, [place] Registered Dietitian  II Certified Diabetes Care and Education Specialist Pager: [phone] After hours Pager: [phone]

## 2019-03-22 NOTE — Evaluation (Signed)
Occupational Therapy Evaluation Patient Details Name: Phillip Owens MRN: [id number] DOB: 28-Feb-1941 Today's Date: 03/22/2019    History of Present Illness Patient is a 78 year old male admitted with AMS, aphasia. 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 No associated hemorrhage or mass effect.  Pt also with acute on chronic respiratory failure with hypoxia and hypercapnia, COPD without acute exacerbation:   Patient supposed to be on CPAP at night but was not wearing it.  He was just admitted at [facility] regional for respiratory failure from 11/29-12/1.    Clinical Impression   Patient is a 78 year old male that lives with his spouse in a two story home, stays on main level. Patient reports at baseline he is fully independent and in better health than his spouse. Patient reports he cooks, cleans, provides transportation for them both. Currently patient requiring mod A for supine to sit and min/mod for sit to supine with decreased sitting balance requiring verbal cues for body mechanics. Patient has decreased short term memory with difficulty recalling events that brought him to the hospital. Even with cues/explination patient calls his wife at the end of eval asking "why did you send me out?" Patient has decreased strength, activity tolerance, balance and safety awareness and acute OT will continue to follow.     Follow Up Recommendations  SNF;Supervision/Assistance - 24 hour    Equipment Recommendations  Other (comment)(defer to next venue)       Precautions / Restrictions Precautions Precautions: Fall Precaution Comments: watch O2/HR Restrictions Weight Bearing Restrictions: No      Mobility Bed Mobility Overal bed mobility: Needs Assistance Bed Mobility: Supine to Sit;Sit to Supine     Supine to sit: Mod assist Sit to supine: Min assist;Mod assist   General bed  mobility comments: mod A to sit due to decreased core strength, min/mod A for safety d/t patient laying himself back to lift LEs and position shoulders  Transfers Overall transfer level: Needs assistance               General transfer comment: set up to attempt sit to stand however patient feels weak, dizzy and continues to lean back/lay himself down    Balance Overall balance assessment: Needs assistance Sitting-balance support: Feet supported;Bilateral upper extremity supported Sitting balance-Leahy Scale: Poor Sitting balance - Comments: patient having difficulty keeping feet planted on the ground, verbal cues for body mechanics and respositioning at the EOB Postural control: Posterior lean     Standing balance comment: unable at this time                           ADL either performed or assessed with clinical judgement   ADL Overall ADL's : Needs assistance/impaired Eating/Feeding: NPO   Grooming: Min guard;Sitting;Minimal assistance Grooming Details (indicate cue type and reason): patient leaning posteriorly on the bed requiring cues/assist for safety Upper Body Bathing: Min guard;Minimal assistance;Sitting   Lower Body Bathing: Maximal assistance;Sitting/lateral leans Lower Body Bathing Details (indicate cue type and reason): patient having difficulty maintaining balance while seated EOB Upper Body Dressing : Min guard;Minimal assistance;Sitting   Lower Body Dressing: Maximal assistance;Sitting/lateral leans   Toilet Transfer: +2 for physical assistance;+2 for safety/equipment;BSC Toilet Transfer Details (indicate cue type and reason): set up for sit to stand from EOB however patient reports feeling dizzy and lays himself straight back onto  bed,; recommend +2 at this time due to decreased safety Toileting- Clothing Manipulation and Hygiene: Maximal assistance;Moderate assistance;Sitting/lateral lean;Sit to/from stand               Vision Patient  Visual Report: Other (comment)(patient reports he cannot see out of his L eye)              Pertinent Vitals/Pain Pain Assessment: No/denies pain     Hand Dominance Right   Extremity/Trunk Assessment Upper Extremity Assessment Upper Extremity Assessment: Generalized weakness   Lower Extremity Assessment Lower Extremity Assessment: Defer to PT evaluation       Communication Communication Communication: HOH   Cognition Arousal/Alertness: Awake/alert Behavior During Therapy: Impulsive Overall Cognitive Status: Impaired/Different from baseline Area of Impairment: Orientation;Memory;Following commands;Safety/judgement                 Orientation Level: Disoriented to;Time;Situation   Memory: Decreased short-term memory Following Commands: Follows one step commands with increased time;Follows one step commands inconsistently Safety/Judgement: Decreased awareness of safety;Decreased awareness of deficits         General Comments  BP once back in bed 157/70, O2 on 4L with bed mobility drops to 84% require cues for pursed lip breathing to increase back to 90's            Home Living Family/patient expects to be discharged to:: Private residence Living Arrangements: Spouse/significant other Available Help at Discharge: Family;Available 24 hours/day Type of Home: House Home Access: Level entry     Home Layout: Two level;Able to live on main level with bedroom/bathroom     Bathroom Shower/Tub: Occupational psychologist: Standard     Home Equipment: Grab bars - tub/shower;Grab bars - toilet;Bedside commode;Walker - 2 wheels;Kasandra Knudsen - single point      Lives With: Spouse    Prior Functioning/Environment Level of Independence: Independent        Comments: Does own ADLs, cooks, cleans. Drives.        OT Problem List: Decreased strength;Decreased activity tolerance;Impaired balance (sitting and/or standing);Decreased safety awareness;Decreased  cognition      OT Treatment/Interventions: Self-care/ADL training;Therapeutic exercise;Energy conservation;Therapeutic activities;Patient/family education;Balance training    OT Goals(Current goals can be found in the care plan section) Acute Rehab OT Goals Patient Stated Goal: to eat OT Goal Formulation: With patient Time For Goal Achievement: 04/05/19 Potential to Achieve Goals: Good  OT Frequency: Min 2X/week           Co-evaluation PT/OT/SLP Co-Evaluation/Treatment: Yes Reason for Co-Treatment: Necessary to address cognition/behavior during functional activity;For patient/therapist safety;To address functional/ADL transfers   OT goals addressed during session: ADL's and self-care      AM-PAC OT "6 Clicks" Daily Activity     Outcome Measure Help from another person eating meals?: Total(currently NPO) Help from another person taking care of personal grooming?: A Little Help from another person toileting, which includes using toliet, bedpan, or urinal?: A Lot Help from another person bathing (including washing, rinsing, drying)?: A Lot Help from another person to put on and taking off regular upper body clothing?: A Little Help from another person to put on and taking off regular lower body clothing?: A Lot 6 Click Score: 13   End of Session Equipment Utilized During Treatment: Oxygen Nurse Communication: Mobility status  Activity Tolerance: Patient limited by fatigue Patient left: in bed;with bed alarm set;with call bell/phone within reach  OT Visit Diagnosis: Unsteadiness on feet (R26.81);History of falling (Z91.81);Muscle weakness (generalized) (M62.81)  [id number] OT Time Calculation (min): 23 min Charges:  OT General Charges $OT Visit: 1 Visit OT Evaluation $OT Eval Moderate Complexity: [place] OT OT office: [place] 03/22/2019, 11:52 AM

## 2019-03-22 NOTE — NC FL2 (Signed)
Phillip Owens     IDENTIFICATION  Patient Name: Phillip Owens Birthdate: July 19, 1940 Sex: male Admission Date (Current Location): 03/21/2019  [facility] and [place] Number:  Herbalist and Address:  The . [facility], [place] [address], [place],  [address]      Provider Number: [id number]  Attending Physician Name and Address:  Nita Sells, MD  Relative Name and Phone Number:  Bonnita Nasuti ([phone]    Current Level of Care: Hospital Recommended Level of Care: [place] Prior Approval Number:    Date Approved/Denied:   PASRR Number: [id number]  Discharge Plan: SNF    Current Diagnoses: Patient Active Problem List   Diagnosis Date Noted  . 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 Acute on chronic respiratory failure with hypoxia and hypercapnia ([place]) 03/21/2019  . SIRS (systemic inflammatory response syndrome) ([place]) 03/21/2019  . Acute metabolic encephalopathy [id number]  . Aphasia 03/21/2019  . Falls 03/21/2019  . COPD (chronic obstructive pulmonary disease) ([place]) 03/21/2019  . AKI (acute kidney injury) ([place]) 03/21/2019  . Generalized weakness 03/21/2019  . OSA (obstructive sleep apnea) 03/21/2019    Orientation RESPIRATION BLADDER Height & Weight     Self, Place  O2(4L/min nasal cannule) Incontinent, External catheter Weight: 222 lb 7.1 oz (100.9 kg) Height:  6' (182.9 cm)  BEHAVIORAL SYMPTOMS/MOOD NEUROLOGICAL BOWEL NUTRITION STATUS      Continent Diet(see discharge summary)  AMBULATORY STATUS COMMUNICATION OF NEEDS Skin   Limited Assist Verbally Skin abrasions(abrasion left knee)                       Personal Care Assistance Level of Assistance  Bathing, Feeding, Dressing, Total care Bathing Assistance: Limited assistance Feeding assistance: Limited assistance Dressing Assistance: Limited assistance Total Care Assistance: Limited assistance    Functional Limitations Info  Sight, Speech, Hearing Sight Info: Adequate Hearing Info: Adequate Speech Info: Adequate    SPECIAL CARE FACTORS FREQUENCY  PT (By licensed PT), OT (By licensed OT)     PT Frequency: min 5x weekly OT Frequency: min 5x weekly            Contractures Contractures Info: Not present    Additional Factors Info  Code Status, Allergies Code Status Info: full Allergies Info: No Known Allergies           Current Medications (03/22/2019):  This is the current hospital active medication list Current Facility-Administered Medications  Medication Dose Route Frequency Provider Last Rate Last Dose  . [START ON 03/23/2019] aspirin EC tablet 81 mg  81 mg Oral Daily Rosalin Hawking, MD      . clopidogrel (PLAVIX) tablet 75 mg  75 mg Oral Daily Rosalin Hawking, MD      . heparin injection 5,000 Units  5,000 Units Subcutaneous Q8H Fuller Plan A, MD   5,000 Units at 03/22/19 [phone]  . ipratropium-albuterol (DUONEB) 0.5-2.5 (3) MG/3ML nebulizer solution 3 mL  3 mL Nebulization QID Fuller Plan A, MD   3 mL at 03/22/19 1229  . ondansetron (ZOFRAN) tablet 4 mg  4 mg Oral Q6H PRN Fuller Plan A, MD       Or  . ondansetron (ZOFRAN) injection 4 mg  4 mg Intravenous Q6H PRN Norval Morton, MD      . Resource ThickenUp Clear   Oral PRN Nita Sells, MD      . sodium chloride flush (NS) 0.9 % injection 3 mL  3  mL Intravenous Q12H Norval Morton, MD         Discharge Medications: Please see discharge summary for a list of discharge medications.  Relevant Imaging Results:  Relevant Lab Results:   Additional Information SSN: [id number]  Alberteen Sam, LCSW

## 2019-03-22 NOTE — TOC Initial Note (Signed)
Transition of Care [facility]) - Initial/Assessment Note    Patient Details  Name: Phillip Owens MRN: [id number] Date of Birth: Sep 15, 1940  Transition of Care [facility]) CM/SW Contact:    Alberteen Sam, [place] Phone Number: [phone] 03/22/2019, 12:54 PM  Clinical Narrative:                  CSW spoke with patient's spouse Bonnita Nasuti regarding discharge planning, as patient continues to not be fully oriented. CSW informed Bonnita Nasuti of PT recommendations of [place] for short term rehab at time of discharge, she is agreeable to [place] faxing out referrals to SNFs in the [facility] area and the providing ehr bed offers of facilitys that can accept him.   CSW will fax out referrals and follow up with Bonnita Nasuti regarding which facilities have made bed offers.   Bonnita Nasuti would also like her daughter Otila Kluver updated about discharge plan as well. She can be reached at [phone] CSW called and updated Otila Kluver regarding discharge plan.    Expected Discharge Plan: Skilled Nursing Facility Barriers to Discharge: Continued Medical Work up   Patient Goals and CMS Choice Patient states their goals for this hospitalization and ongoing recovery are:: to go to rehab then home CMS Medicare.gov Compare Post Acute Care list provided to:: Patient Represenative (must comment)(spouse Bonnita Nasuti) Choice offered to / list presented to : Spouse  Expected Discharge Plan and Services Expected Discharge Plan: [place] Acute Care Choice: [place] Living arrangements for the past 2 months: Single Family Home                                      Prior Living Arrangements/Services Living arrangements for the past 2 months: Single Family Home Lives with:: Spouse Patient language and need for interpreter reviewed:: Yes Do you feel safe going back to the place where you live?: Yes      Need for Family Participation in Patient Care: Yes (Comment) Care giver support system  in place?: Yes (comment)   Criminal Activity/Legal Involvement Pertinent to Current Situation/Hospitalization: No - Comment as needed  Activities of Daily Living Home Assistive Devices/Equipment: Cane (specify quad or straight) ADL Screening (condition at time of admission) Patient's cognitive ability adequate to safely complete daily activities?: Yes Is the patient deaf or have difficulty hearing?: No Does the patient have difficulty seeing, even when wearing glasses/contacts?: No Does the patient have difficulty concentrating, remembering, or making decisions?: Yes Patient able to express need for assistance with ADLs?: Yes Does the patient have difficulty dressing or bathing?: Yes Independently performs ADLs?: No Communication: Independent Dressing (OT): Dependent Grooming: Needs assistance Feeding: Independent Bathing: Dependent Toileting: Needs assistance In/Out Bed: Needs assistance Walks in Home: Needs assistance Does the patient have difficulty walking or climbing stairs?: Yes Weakness of Legs: Both Weakness of Arms/Hands: Both  Permission Sought/Granted Permission sought to share information with : Case Manager, Customer service manager, Family Supports Permission granted to share information with : Yes, Verbal Permission Granted  Share Information with NAME: Bonnita Nasuti  Permission granted to share info w AGENCY: SNFs  Permission granted to share info w Relationship: spouse  Permission granted to share info w Contact Information: [phone]  Emotional Assessment Appearance:: Other (Comment Required(remote- unable to assess) Attitude/Demeanor/Rapport: Gracious Affect (typically observed): Calm Orientation: : Oriented to Self Alcohol / Substance Use: Not Applicable Psych Involvement: No (comment)  Admission  diagnosis:  Hyperkalemia [E87.5] Encephalopathy [G93.40] AKI (acute kidney injury) ([place]) [N17.9] Patient Active Problem List   Diagnosis Date Noted  .  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 Acute on chronic respiratory failure with hypoxia and hypercapnia ([place]) 03/21/2019  . SIRS (systemic inflammatory response syndrome) ([place]) 03/21/2019  . Acute metabolic encephalopathy [id number]  . Aphasia 03/21/2019  . Falls 03/21/2019  . COPD (chronic obstructive pulmonary disease) ([place]) 03/21/2019  . AKI (acute kidney injury) ([place]) 03/21/2019  . Generalized weakness 03/21/2019  . OSA (obstructive sleep apnea) 03/21/2019   PCP:  Patient, No Pcp Per Pharmacy:   [id number] [place], [place] - [address] [place] [address] Phone: [phone] Fax: [phone]     Social Determinants of Health (SDOH) Interventions    Readmission Risk Interventions No flowsheet data found.

## 2019-03-22 NOTE — Plan of Care (Signed)
  Problem: Education: Goal: Knowledge of General Education information will improve Description Including pain rating scale, medication(s)/side effects and non-pharmacologic comfort measures Outcome: Progressing   Problem: Health Behavior/Discharge Planning: Goal: Ability to manage health-related needs will improve Outcome: Progressing   

## 2019-03-22 NOTE — Evaluation (Signed)
Clinical/Bedside Swallow Evaluation Patient Details  Name: Phillip Owens MRN: [id number] Date of Birth: 07-29-1940  Today's Date: 03/22/2019 Time: SLP Start Time (ACUTE ONLY): 0845 SLP Stop Time (ACUTE ONLY): 0920 SLP Time Calculation (min) (ACUTE ONLY): 35 min  Past Medical History:  Past Medical History:  Diagnosis Date  . Cancer ([place])   . Chronic respiratory failure with hypoxia, on home O2 therapy ([place])   . COPD (chronic obstructive pulmonary disease) ([place])   . Emphysema (subcutaneous) (surgical) resulting from a procedure   . Essential hypertension   . GERD (gastroesophageal reflux disease)   . Hyperglycemia   . Hyperlipidemia   . Multiple lung nodules on CT   . Obstructive sleep apnea on CPAP   . Oxygen dependent   . Renal insufficiency bilateral   . Sensorineural hearing loss    HPI:  Pt admitted with AMS, aphasia. 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 No associated hemorrhage or mass effect.  Pt also with acute on chronic respiratory failure with hypoxia and hypercapnia, COPD without acute exacerbation:   Patient supposed to be on CPAP at night but was not wearing it.  He was just admitted at [facility] for respiratory failure from 11/29-12/1.   Assessment / Plan / Recommendation Clinical Impression  Pt demonstrates immediate coughing following sips of water, increased with larger volume. Tolerates regular solids and purees well. No evidence of neuromuscular impairment; deficits could verly likely be baseline per pt report. Will f/u with instrumental assessment for best recommendations. Until MBS complete pt may attempt mech soft diet and thin liquids (no dentures present).  Also completed SLE given acute CVA. See next note.  SLP Visit Diagnosis: Dysphagia, pharyngeal phase (R13.13)    Aspiration Risk  Mild aspiration risk    Diet Recommendation  Dysphagia 3 (Mech soft);Thin liquid   Liquid Administration via: Cup;Straw Medication Administration: Whole meds with liquid Supervision: Patient able to self feed    Other  Recommendations Oral Care Recommendations: Oral care BID   Follow up Recommendations        Frequency and Duration            Prognosis        Swallow Study   General HPI: Pt admitted with AMS, aphasia. 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 No associated hemorrhage or mass effect.  Pt also with acute on chronic respiratory failure with hypoxia and hypercapnia, COPD without acute exacerbation:   Patient supposed to be on CPAP at night but was not wearing it.  He was just admitted at [facility] regional for respiratory failure from 11/29-12/1. Type of Study: Bedside Swallow Evaluation Diet Prior to this Study: NPO History of Recent Intubation: No Behavior/Cognition: Alert;Cooperative;Pleasant mood Oral Cavity Assessment: Within Functional Limits Oral Care Completed by SLP: No Oral Cavity - Dentition: Edentulous Vision: Functional for self-feeding Self-Feeding Abilities: Able to feed self Patient Positioning: Upright in bed Baseline Vocal Quality: Normal Volitional Cough: Strong Volitional Swallow: Able to elicit    Oral/Motor/Sensory Function Overall Oral Motor/Sensory Function: Within functional limits   Ice Chips Ice chips: Within functional limits   Thin Liquid Thin Liquid: Impaired Presentation: Cup;Straw;Self Fed Pharyngeal  Phase Impairments: Cough - Immediate    Nectar Thick Nectar Thick Liquid: Not tested   Honey Thick Honey Thick Liquid: Not  tested   Puree Puree: Within functional limits Presentation: Spoon   Solid     Solid: Within functional limits     Phillip [place], MA [place] Pager [phone] Office [phone]  Phillip Owens 03/22/2019,10:12 AM

## 2019-03-22 NOTE — Progress Notes (Signed)
STROKE TEAM PROGRESS NOTE   INTERVAL HISTORY Pt RN at bedside. He is sitting in bed, had finished breakfast. He is awake alert orientated, denies pain, SOB, denies heart palpitation, or racing heart. Can not remember what happened yesterday and why he is in the hospital. MRI showed b/l small infarcts.   Vitals:   03/22/19 0124 03/22/19 0531 03/22/19 0731 03/22/19 0900  BP: (!) 160/77 111/63 (!) 147/72 (!) 170/72  Pulse: 98 84 (!) 101   Resp: 20 20 20    Temp: 98.5 F (36.9 C) (!) 97.3 F (36.3 C) 98.9 F (37.2 C)   TempSrc: Oral Oral Oral   SpO2: 95% 94% 97%   Weight:  100.9 kg    Height:        CBC:  Recent Labs  Lab 03/21/19 1215  03/21/19 1758 03/22/19 0539  WBC 12.5*  --   --  8.5  NEUTROABS 11.1*  --   --   --   HGB 12.7*   < > 12.9* 10.9*  HCT 42.8   < > 38.0* 36.0*  MCV 91.5  --   --  89.3  PLT 219  --   --  234   < > = values in this interval not displayed.    Basic Metabolic Panel:  Recent Labs  Lab 03/21/19 1215 03/21/19 1216  03/21/19 1758 03/22/19 0539  NA 139 136   < > 139 142  K 6.4* 6.4*   < > 4.7 4.5  CL 94* 97*  --   --  95*  CO2 31  --   --   --  38*  GLUCOSE 156* 153*  --   --  89  BUN 27* 38*  --   --  24*  CREATININE 2.40* 2.30*  --   --  1.50*  CALCIUM 8.9  --   --   --  8.9   < > = values in this interval not displayed.   Lipid Panel: No results found for: CHOL, TRIG, HDL, CHOLHDL, VLDL, LDLCALC HgbA1c: No results found for: HGBA1C Urine Drug Screen:     Component Value Date/Time   LABOPIA POSITIVE (A) 03/21/2019 1703   COCAINSCRNUR NONE DETECTED 03/21/2019 1703   LABBENZ NONE DETECTED 03/21/2019 1703   AMPHETMU NONE DETECTED 03/21/2019 1703   THCU NONE DETECTED 03/21/2019 1703   LABBARB NONE DETECTED 03/21/2019 1703    Alcohol Level No results found for: ETH  IMAGING Ct Code Stroke Cta Head W/wo Contrast  Result Date: 03/21/2019 CLINICAL DATA:  Altered mental status.  Aphasia. EXAM: CT ANGIOGRAPHY HEAD AND NECK CT PERFUSION  BRAIN TECHNIQUE: Multidetector CT imaging of the head and neck was performed using the standard protocol during bolus administration of intravenous contrast. Multiplanar CT image reconstructions and MIPs were obtained to evaluate the vascular anatomy. Carotid stenosis measurements (when applicable) are obtained utilizing NASCET criteria, using the distal internal carotid diameter as the denominator. Multiphase CT imaging of the brain was performed following IV bolus contrast injection. Subsequent parametric perfusion maps were calculated using RAPID software. CONTRAST:  118mL OMNIPAQUE IOHEXOL 350 MG/ML SOLN COMPARISON:  Head CT earlier same day FINDINGS: CTA NECK FINDINGS Aortic arch: Aortic atherosclerosis. No aneurysm or dissection. Branching pattern is normal without origin stenosis. Right carotid system: Common carotid artery widely patent to the bifurcation. Calcified plaque at the carotid bifurcation and ICA bulb. No stenosis when compared to the more distal cervical ICA diameter. Some plaque affecting the distal cervical ICA but no stenosis. Left  carotid system: Common carotid artery widely patent to the bifurcation. Calcified plaque at the carotid bifurcation and ICA bulb but no stenosis. Distal cervical ICA shows some plaque but no stenosis. Vertebral arteries: Both vertebral artery origins are widely patent. Both vertebral arteries appear approximately equal in size an widely patent through the cervical region to the foramen magnum. Skeleton: Minimal cervical spondylosis. Other neck: No mass or lymphadenopathy. Upper chest: Emphysema and pulmonary scarring.  No focal lesion. Review of the MIP images confirms the above findings CTA HEAD FINDINGS Anterior circulation: Both internal carotid arteries are patent through the skull base and siphon regions. Atherosclerotic calcification within the carotid siphon regions. Stenosis estimated at 50% on each side. The anterior and middle cerebral vessels are patent  without proximal stenosis, aneurysm vascular malformation. No large or medium vessel occlusion is identified. Primary anterior circulation supply to both posterior cerebral arteries. Posterior circulation: Both vertebral arteries are patent through the foramen magnum to the basilar. Basilar artery is small but does not show focal stenosis. Both superior cerebellar arteries show flow. As noted above, both posterior cerebral arteries receive the majority of there supply from the anterior circulation. Venous sinuses: Patent and normal. Anatomic variants: None significant otherwise. Review of the MIP images confirms the above findings CT Brain Perfusion Findings: ASPECTS: 10 CBF (<30%) Volume: 14mL Perfusion (Tmax>6.0s) volume: 18mL Mismatch Volume: 5mL Infarction Location:None present IMPRESSION: Normal CT perfusion study. Atherosclerotic change at both carotid bifurcations but no stenosis. Atherosclerotic change in the carotid siphon regions without stenosis greater than 50%. No intracranial large or medium vessel occlusion or correctable proximal stenosis. Aortic atherosclerosis. These results were communicated to Dr. Rory Percy at 12:59 pmon 12/3/2020by text page via the [facility] messaging system. Electronically Signed   By: Nelson Chimes M.D.   On: 03/21/2019 13:01   Dg Abd 1 View  Result Date: 03/22/2019 CLINICAL DATA:  Transaminitis EXAM: ABDOMEN - 1 VIEW COMPARISON:  None. FINDINGS: The bowel gas pattern is normal. No radio-opaque calculi or other significant radiographic abnormality are seen. IMPRESSION: Negative. Electronically Signed   By: Prudencio Pair M.D.   On: 03/22/2019 00:40   Ct Head Wo Contrast  Result Date: 03/21/2019 CLINICAL DATA:  Altered mental status and aphasia. EXAM: CT HEAD WITHOUT CONTRAST TECHNIQUE: Contiguous axial images were obtained from the base of the skull through the vertex without intravenous contrast. COMPARISON:  None. FINDINGS: Brain: No evidence of acute infarction, hemorrhage,  hydrocephalus, extra-axial collection or mass lesion/mass effect. 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 There is mild diffuse low-attenuation within the subcortical and periventricular white matter compatible with chronic microvascular disease. Vascular: No hyperdense vessel or unexpected calcification. Skull: Normal. Negative for fracture or focal lesion. Sinuses/Orbits: Paranasal sinuses are clear. Mastoid air cells are clear. Post op changes involving both orbits noted. Other: None IMPRESSION: 1. No acute intracranial abnormalities. 2. Chronic small vessel ischemic change . Electronically Signed   By: Kerby Moors M.D.   On: 03/21/2019 12:08   Ct Code Stroke Cta Neck W/wo Contrast  Result Date: 03/21/2019 CLINICAL DATA:  Altered mental status.  Aphasia. EXAM: CT ANGIOGRAPHY HEAD AND NECK CT PERFUSION BRAIN TECHNIQUE: Multidetector CT imaging of the head and neck was performed using the standard protocol during bolus administration of intravenous contrast. Multiplanar CT image reconstructions and MIPs were obtained to evaluate the vascular anatomy. Carotid stenosis measurements (when applicable) are obtained utilizing NASCET criteria, using the distal internal carotid diameter as the denominator. Multiphase CT imaging of the brain was performed  following IV bolus contrast injection. Subsequent parametric perfusion maps were calculated using RAPID software. CONTRAST:  174mL OMNIPAQUE IOHEXOL 350 MG/ML SOLN COMPARISON:  Head CT earlier same day FINDINGS: CTA NECK FINDINGS Aortic arch: Aortic atherosclerosis. No aneurysm or dissection. Branching pattern is normal without origin stenosis. Right carotid system: Common carotid artery widely patent to the bifurcation. Calcified plaque at the carotid bifurcation and ICA bulb. No stenosis when compared to the more distal cervical ICA diameter. Some plaque affecting the distal cervical ICA but no stenosis. Left carotid system: Common carotid artery  widely patent to the bifurcation. Calcified plaque at the carotid bifurcation and ICA bulb but no stenosis. Distal cervical ICA shows some plaque but no stenosis. Vertebral arteries: Both vertebral artery origins are widely patent. Both vertebral arteries appear approximately equal in size an widely patent through the cervical region to the foramen magnum. Skeleton: Minimal cervical spondylosis. Other neck: No mass or lymphadenopathy. Upper chest: Emphysema and pulmonary scarring.  No focal lesion. Review of the MIP images confirms the above findings CTA HEAD FINDINGS Anterior circulation: Both internal carotid arteries are patent through the skull base and siphon regions. Atherosclerotic calcification within the carotid siphon regions. Stenosis estimated at 50% on each side. The anterior and middle cerebral vessels are patent without proximal stenosis, aneurysm vascular malformation. No large or medium vessel occlusion is identified. Primary anterior circulation supply to both posterior cerebral arteries. Posterior circulation: Both vertebral arteries are patent through the foramen magnum to the basilar. Basilar artery is small but does not show focal stenosis. Both superior cerebellar arteries show flow. As noted above, both posterior cerebral arteries receive the majority of there supply from the anterior circulation. Venous sinuses: Patent and normal. Anatomic variants: None significant otherwise. Review of the MIP images confirms the above findings CT Brain Perfusion Findings: ASPECTS: 10 CBF (<30%) Volume: 7mL Perfusion (Tmax>6.0s) volume: 46mL Mismatch Volume: 16mL Infarction Location:None present IMPRESSION: Normal CT perfusion study. Atherosclerotic change at both carotid bifurcations but no stenosis. Atherosclerotic change in the carotid siphon regions without stenosis greater than 50%. No intracranial large or medium vessel occlusion or correctable proximal stenosis. Aortic atherosclerosis. These results  were communicated to Dr. Rory Percy at 12:59 pmon 12/3/2020by text page via the [facility] messaging system. Electronically Signed   By: Nelson Chimes M.D.   On: 03/21/2019 13:01   Mr Brain Wo Contrast  Result Date: 03/22/2019 CLINICAL DATA:  78 year old male with altered mental status. EXAM: MRI HEAD WITHOUT CONTRAST TECHNIQUE: Multiplanar, multiecho pulse sequences of the brain and surrounding structures were obtained without intravenous contrast. COMPARISON:  CT head, CTA head and neck and CT Perfusion 03/21/2019. [facility] brain MRI 07/29/2016. FINDINGS: Brain: Patchy and scattered restricted diffusion in the bilateral cerebellar hemispheres slightly greater on the right (series 5, image 57). Sparing of the brainstem. But there is punctate restricted diffusion at the bilateral lentiform/genu of the internal capsules (series 5, image 74). There is also a small area of restricted diffusion in the medial left periatrial white matter near the tail of the left hippocampus on series 5, image 71. No definite hippocampal diffusion abnormality. There is also a tiny cortical focus of restricted diffusion in the superior right parietal lobe (series 7, image 44). Associated T2 and FLAIR hyperintensity at the areas of acute involvement. No acute hemorrhage or mass effect. Stable chronic lacunar infarct in the right basal ganglia, right pons. Chronic microhemorrhages in the pons are stable since 2018. Patchy and confluent bilateral cerebral white matter T2 and FLAIR  hyperintensity is stable to mildly progressed since 2018. No superimposed midline shift, mass effect, evidence of mass lesion, ventriculomegaly, extra-axial collection or acute intracranial hemorrhage. Cervicomedullary junction and pituitary are within normal limits. Vascular: Major intracranial vascular flow voids are stable since 2018. Skull and upper cervical spine: Negative visible cervical spine. Visualized bone marrow signal is within normal limits.  Sinuses/Orbits: Stable and negative. Other: Mastoids remain clear. Visible internal auditory structures appear normal. IMPRESSION: 1. Multiple small acute infarcts, most pronounced in the bilateral cerebellum but also punctate foci in the bilateral basal ganglia, right parietal lobe. No associated hemorrhage or mass effect. 2. The pattern might indicate a recent embolic event. But synchronous small vessel disease is also a possibility (see #3). 3. 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 Electronically Signed   By: Genevie Ann M.D.   On: 03/22/2019 01:13   Ct Code Stroke Cerebral Perfusion With Contrast  Result Date: 03/21/2019 CLINICAL DATA:  Altered mental status.  Aphasia. EXAM: CT ANGIOGRAPHY HEAD AND NECK CT PERFUSION BRAIN TECHNIQUE: Multidetector CT imaging of the head and neck was performed using the standard protocol during bolus administration of intravenous contrast. Multiplanar CT image reconstructions and MIPs were obtained to evaluate the vascular anatomy. Carotid stenosis measurements (when applicable) are obtained utilizing NASCET criteria, using the distal internal carotid diameter as the denominator. Multiphase CT imaging of the brain was performed following IV bolus contrast injection. Subsequent parametric perfusion maps were calculated using RAPID software. CONTRAST:  112mL OMNIPAQUE IOHEXOL 350 MG/ML SOLN COMPARISON:  Head CT earlier same day FINDINGS: CTA NECK FINDINGS Aortic arch: Aortic atherosclerosis. No aneurysm or dissection. Branching pattern is normal without origin stenosis. Right carotid system: Common carotid artery widely patent to the bifurcation. Calcified plaque at the carotid bifurcation and ICA bulb. No stenosis when compared to the more distal cervical ICA diameter. Some plaque affecting the distal cervical ICA but no stenosis. Left carotid system: Common carotid  artery widely patent to the bifurcation. Calcified plaque at the carotid bifurcation and ICA bulb but no stenosis. Distal cervical ICA shows some plaque but no stenosis. Vertebral arteries: Both vertebral artery origins are widely patent. Both vertebral arteries appear approximately equal in size an widely patent through the cervical region to the foramen magnum. Skeleton: Minimal cervical spondylosis. Other neck: No mass or lymphadenopathy. Upper chest: Emphysema and pulmonary scarring.  No focal lesion. Review of the MIP images confirms the above findings CTA HEAD FINDINGS Anterior circulation: Both internal carotid arteries are patent through the skull base and siphon regions. Atherosclerotic calcification within the carotid siphon regions. Stenosis estimated at 50% on each side. The anterior and middle cerebral vessels are patent without proximal stenosis, aneurysm vascular malformation. No large or medium vessel occlusion is identified. Primary anterior circulation supply to both posterior cerebral arteries. Posterior circulation: Both vertebral arteries are patent through the foramen magnum to the basilar. Basilar artery is small but does not show focal stenosis. Both superior cerebellar arteries show flow. As noted above, both posterior cerebral arteries receive the majority of there supply from the anterior circulation. Venous sinuses: Patent and normal. Anatomic variants: None significant otherwise. Review of the MIP images confirms the above findings CT Brain Perfusion Findings: ASPECTS: 10 CBF (<30%) Volume: 3mL Perfusion (Tmax>6.0s) volume: 73mL Mismatch Volume: 56mL Infarction Location:None present IMPRESSION: Normal CT perfusion study. Atherosclerotic change at both carotid bifurcations but no stenosis. Atherosclerotic change in the carotid  siphon regions without stenosis greater than 50%. No intracranial large or medium vessel occlusion or correctable proximal stenosis. Aortic atherosclerosis. These  results were communicated to Dr. Rory Percy at 12:59 pmon 12/3/2020by text page via the [facility] messaging system. Electronically Signed   By: Nelson Chimes M.D.   On: 03/21/2019 13:01   Dg Chest Portable 1 View  Result Date: 03/21/2019 CLINICAL DATA:  Shortness of breath. EXAM: PORTABLE CHEST 1 VIEW COMPARISON:  March 17, 2019. FINDINGS: Stable cardiomediastinal silhouette. No pneumothorax or pleural effusion is noted. Both lungs are clear. The visualized skeletal structures are unremarkable. IMPRESSION: No active disease. Electronically Signed   By: Marijo Conception M.D.   On: 03/21/2019 19:06    PHYSICAL EXAM  Temp:  [97.3 F (36.3 C)-98.9 F (37.2 C)] 98.9 F (37.2 C) (12/04 0731) Pulse Rate:  [79-107] 101 (12/04 1049) Resp:  [14-24] 20 (12/04 0731) BP: (106-171)/(62-90) 158/69 (12/04 1049) SpO2:  [91 %-100 %] 97 % (12/04 0731) FiO2 (%):  [40 %] 40 % (12/03 1739) Weight:  [100.9 kg-102 kg] 100.9 kg (12/04 0531)  General - Well nourished, well developed, in no apparent distress.  Ophthalmologic - fundi not visualized due to noncooperation.  Cardiovascular - Regular rhythm and rate.  Mental Status -  Level of arousal and orientation to month, place, and age were intact, however not to year. Language including expression, naming, repetition, comprehension was assessed and found intact.  Cranial Nerves II - XII - II - Visual field intact OU. III, IV, VI - Extraocular movements intact. V - Facial sensation intact bilaterally. VII - right nasolabial fold mild flattening. VIII - Hearing & vestibular intact bilaterally. X - Palate elevates symmetrically. XI - Chin turning & shoulder shrug intact bilaterally. XII - Tongue protrusion intact.  Motor Strength - The patient's strength was symmetrical in all extremities except left shoulder deltoid 3/5 with limited ROM which is chronic from previous shoulder injury 60 years ago.  Bulk was normal and fasciculations were absent.   Motor Tone -  Muscle tone was assessed at the neck and appendages and was normal.  Reflexes - The patient's reflexes were symmetrical in all extremities and he had no pathological reflexes.  Sensory - Light touch, temperature/pinprick were assessed and were symmetrical.    Coordination - The patient had normal movements in the hands with no ataxia or dysmetria.  Tremor was absent.  Gait and Station - deferred.   ASSESSMENT/PLAN Mr. Phillip Owens is a 78 y.o. 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 Synchronized small vessel disease in the setting of hypoxia hypercarbia   CT head no acute abnormality. Small vessel disease.   CTA head & neck B ICA atherosclerosis at bifurcation, siphons. Aortic atherosclerosis.   CT perfusion normal  MRI  Multiple small infarcts, most in B cerebellum, B basal ganglia and R parietal lobe and left splenium. Moderate small vessel disease in pons, R basal ganglia and cerebral white matter stable since 2018  2D Echo pending  EEG no sz  Will recommend loop recorder to rule out afib next week before discharge.  LDL 73  HgbA1c pending  Heparin 5000 units sq tid for VTE prophylaxis  No antithrombotic prior  to admission, now on ASA 81 and plavix 75. Continue DAPT for 3 weeks and then ASA alone  Therapy recommendations:  pending   Disposition:  pending  (lives w/ wife PTA)  Encephalopathy   Pt stroke on MRI can not explain his presenting symptoms  Presented with agitation, combativeness, obtundation, AMS  EEG no seizure   Likely multifactorial - elevated LFTs, hypoxia,  hypercarbia, leukocytosis, AKI and hyperkalemia  Much improved now  Continue correcting underlying conditions  AKI  Not quite sure baseline Cre  Cre 2.4->2.3->IVF->1.5  Encourage po intake  Off IVF  Acute on chronic respiratory failure w/ hypoxia COPD w/o exacerbation on home O2  PCO2 70->70  PO2 61->110  On [place]  Much improved  CXR neg  Elevated LFTs   AST 490->1211, ALT 457->865  INR 1.3, Ammonia 23  Total bilirubin normal   Not candidate for statin at this time  Hypertension  Stable . Permissive hypertension (OK if < 220/120) but gradually normalize in 5-7 days . Long-term BP goal normotensive  Hyperlipidemia  Home meds:  No statin  Now with transaminitis, not a candidate for statin at thie time  LDL 73, goal < 70  Other Stroke Risk Factors  Advanced age  Hx ALCOHOL use, none current  Obesity, Body mass index is 30.17 kg/m., recommend weight loss, diet and exercise as appropriate   Obstructive sleep apnea, on CPAP at home  Other Active Problems  Hyperkalemia K 6.4->4.5  Leukocytosis - WBC 12.5->8.5. B Cx no growth <24h   GERD  Hospital day # 1  I spent  35 minutes in total face-to-face time with the patient, more than 50% of which was spent in counseling and coordination of care, reviewing test results, images and medication, and discussing the diagnosis of stroke, encephalopathy, respiratory failure, AKI, leukocytosis, treatment plan and potential prognosis. This patient's care requiresreview of multiple databases, neurological assessment, discussion with family, other specialists and medical decision making of high complexity. I also discussed with Dr. Verlon Au.   Rosalin Hawking, MD PhD Stroke Neurology 03/22/2019 11:54 AM    To contact Stroke Continuity provider, please refer to [email or web address] After hours, contact General Neurology

## 2019-03-22 NOTE — Evaluation (Signed)
Physical Therapy Evaluation Patient Details Name: Phillip Owens MRN: [id number] DOB: 1940-12-11 Today's Date: 03/22/2019   History of Present Illness  Patient is a 78 year old male admitted with AMS, aphasia. 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 No associated hemorrhage or mass effect.  Pt also with acute on chronic respiratory failure with hypoxia and hypercapnia, COPD without acute exacerbation:   Patient supposed to be on CPAP at night but was not wearing it.  He was just admitted at [facility] regional for respiratory failure from 11/29-12/1.   Clinical Impression  Patient presents with generalized weakness, impaired balance, expressive deficits, impaired cognition and impaired mobility s/p above. Pt lives at home with wife and reports being independent for ADLs and IADLs. Today, pt requires Mod A for bed mobility with posterior bias. Sp02 dropped to 84% on 5L/min 02 [place] and HR up to 142 bpm sitting EOB. Difficulty maintaining sitting balance. Pt reports dizziness and weakness, and laid self down without warning, "I am weaker than I thought, I will be able to walk tomorrow." Not sure of level of support pt has at home with wife. Would benefit from post acute rehab to maximize independence and mobility prior to return home. Will follow acutely.    Follow Up Recommendations SNF;Supervision for mobility/OOB    Equipment Recommendations  None recommended by PT    Recommendations for Other Services       Precautions / Restrictions Precautions Precautions: Fall Precaution Comments: watch O2/HR Restrictions Weight Bearing Restrictions: No      Mobility  Bed Mobility Overal bed mobility: Needs Assistance Bed Mobility: Supine to Sit;Sit to Supine     Supine to sit: Mod assist;HOB elevated Sit to supine: Min assist;Mod assist   General bed mobility comments: mod A to  sit due to decreased core strength, min/mod A for safety d/t patient laying himself back to lift LEs and position shoulders.  Transfers Overall transfer level: Needs assistance               General transfer comment: set up to attempt sit to stand however patient feels weak, dizzy and continues to lean back/lay himself down without warning.  Ambulation/Gait             General Gait Details: Unable at this time  Financial trader Rankin (Stroke Patients Only) Modified Rankin (Stroke Patients Only) Pre-Morbid Rankin Score: Slight disability Modified Rankin: Moderately severe disability     Balance Overall balance assessment: Needs assistance Sitting-balance support: Feet supported;Bilateral upper extremity supported Sitting balance-Leahy Scale: Poor Sitting balance - Comments: patient having difficulty keeping feet planted on the ground, verbal cues for body mechanics and respositioning at the EOB, posterior bias Postural control: Posterior lean     Standing balance comment: unable at this time                             Pertinent Vitals/Pain Pain Assessment: No/denies pain    Home Living Family/patient expects to be discharged to:: Private residence Living Arrangements: Spouse/significant other Available Help at Discharge: Family;Available 24 hours/day Type of Home: House Home Access: Level entry     Home Layout: Two level;Able to live on main level with bedroom/bathroom Home Equipment: Grab bars - tub/shower;Grab bars - toilet;Bedside commode;Walker -  2 wheels;Cane - single point      Prior Function Level of Independence: Independent         Comments: Does own ADLs, cooks, cleans. Drives. not sure of accuracy of pt's reported PLOF as pt reports he helps his wife with IADls.     Hand Dominance   Dominant Hand: Right    Extremity/Trunk Assessment   Upper Extremity Assessment Upper Extremity  Assessment: Defer to OT evaluation    Lower Extremity Assessment Lower Extremity Assessment: Generalized weakness       Communication   Communication: HOH;Expressive difficulties  Cognition Arousal/Alertness: Awake/alert Behavior During Therapy: Impulsive Overall Cognitive Status: Impaired/Different from baseline Area of Impairment: Orientation;Memory;Following commands;Safety/judgement                 Orientation Level: Disoriented to;Time;Situation   Memory: Decreased short-term memory Following Commands: Follows one step commands with increased time;Follows one step commands inconsistently Safety/Judgement: Decreased awareness of safety;Decreased awareness of deficits            General Comments General comments (skin integrity, edema, etc.): Sp02 dropped to 84% on 5L/min 02, HR up to 142 bpm. BP post session 157/70    Exercises     Assessment/Plan    PT Assessment Patient needs continued PT services  PT Problem List Decreased strength;Decreased mobility;Decreased safety awareness;Decreased balance;Decreased activity tolerance;Decreased cognition;Cardiopulmonary status limiting activity       PT Treatment Interventions Therapeutic activities;Gait training;Therapeutic exercise;Patient/family education;Balance training;Functional mobility training;DME instruction;Cognitive remediation    PT Goals (Current goals can be found in the Care Plan section)  Acute Rehab PT Goals Patient Stated Goal: to eat something PT Goal Formulation: With patient Time For Goal Achievement: 04/05/19 Potential to Achieve Goals: Fair    Frequency Min 3X/week   Barriers to discharge        Co-evaluation   Reason for Co-Treatment: Necessary to address cognition/behavior during functional activity;For patient/therapist safety;To address functional/ADL transfers   OT goals addressed during session: ADL's and self-care       AM-PAC PT "6 Clicks" Mobility  Outcome Measure Help  needed turning from your back to your side while in a flat bed without using bedrails?: A Lot Help needed moving from lying on your back to sitting on the side of a flat bed without using bedrails?: A Lot Help needed moving to and from a bed to a chair (including a wheelchair)?: A Lot Help needed standing up from a chair using your arms (e.g., wheelchair or bedside chair)?: A Lot Help needed to walk in hospital room?: A Lot Help needed climbing 3-5 steps with a railing? : Total 6 Click Score: 11    End of Session Equipment Utilized During Treatment: Oxygen Activity Tolerance: Treatment limited secondary to medical complications (Comment)(weakness, dizziness, low 02) Patient left: in bed;with call bell/phone within reach;with bed alarm set Nurse Communication: Mobility status PT Visit Diagnosis: Muscle weakness (generalized) (M62.81);Difficulty in walking, not elsewhere classified (R26.2);Dizziness and giddiness (R42)    Time: [id number] PT Time Calculation (min) (ACUTE ONLY): 21 min   Charges:   PT Evaluation $PT Eval Moderate Complexity: 1 Mod          Marisa Severin, PT, DPT Acute Rehabilitation Services Pager [phone] Office [phone]      Marguarite Arbour A Sabra Heck 03/22/2019, 12:34 PM

## 2019-03-22 NOTE — Progress Notes (Signed)
Tele reported that pt's heart rate went up to 140.  BP 170/72 HR103 Pt was asymptomatic. Notified MD.

## 2019-03-22 NOTE — Plan of Care (Signed)
Multiple strokes on MRI. Stroke team to follow  Stroke work-up  -- Amie [place], MD [facility] Pager: [phone] If 7pm to 7am, please call on call as listed on AMION.

## 2019-03-22 NOTE — Progress Notes (Signed)
  Echocardiogram 2D Echocardiogram has been performed.  Johny Chess 03/22/2019, 3:05 PM

## 2019-03-22 NOTE — Evaluation (Signed)
Speech Language Pathology Evaluation Patient Details Name: Amran Marine MRN: [id number] DOB: 22-Jun-1940 Today's Date: 03/22/2019 Time: [id number] SLP Time Calculation (min) (ACUTE ONLY): 35 min  Problem List:  Patient Active Problem List   Diagnosis Date Noted  . 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 Acute on chronic respiratory failure with hypoxia and hypercapnia ([place]) 03/21/2019  . SIRS (systemic inflammatory response syndrome) ([place]) 03/21/2019  . Acute metabolic encephalopathy [id number]  . Aphasia 03/21/2019  . Falls 03/21/2019  . COPD (chronic obstructive pulmonary disease) ([place]) 03/21/2019  . AKI (acute kidney injury) ([place]) 03/21/2019  . Generalized weakness 03/21/2019  . OSA (obstructive sleep apnea) 03/21/2019   Past Medical History:  Past Medical History:  Diagnosis Date  . Cancer ([place])   . Chronic respiratory failure with hypoxia, on home O2 therapy ([place])   . COPD (chronic obstructive pulmonary disease) ([place])   . Emphysema (subcutaneous) (surgical) resulting from a procedure   . Essential hypertension   . GERD (gastroesophageal reflux disease)   . Hyperglycemia   . Hyperlipidemia   . Multiple lung nodules on CT   . Obstructive sleep apnea on CPAP   . Oxygen dependent   . Renal insufficiency bilateral   . Sensorineural hearing loss    HPI:  Pt admitted with AMS, aphasia. 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 No associated hemorrhage or mass effect.  Pt also with acute on chronic respiratory failure with hypoxia and hypercapnia, COPD without acute exacerbation:   Patient supposed to be on CPAP at night but was not wearing it.  He was just admitted at [facility] for respiratory failure from 11/29-12/1.   Assessment / Plan / Recommendation Clinical Impression  Pt reports cognition WNL at baseline though no family present  to confirm. Today he presents with in tact speech and language, but overt short term memory impairment and difficulty problem solving simple tasks. His attention is sustained to task but he cannot hold relevent information for cognitive processes. Will likely benefit from simple compensatory strategies such as note taking and use of extranal devices for memory. Will f/u for teaching while admitted. May need f/u as an OP depending on progress.     SLP Assessment  SLP Recommendation/Assessment: Patient needs continued Speech Lanaguage Pathology Services SLP Visit Diagnosis: Cognitive communication deficit (R41.841)    Follow Up Recommendations  Outpatient SLP    Frequency and Duration min 2x/week  1 week      SLP Evaluation Cognition  Overall Cognitive Status: Impaired/Different from baseline Arousal/Alertness: Awake/alert Orientation Level: Oriented to person;Oriented to place Attention: Sustained;Selective;Alternating Sustained Attention: Appears intact Selective Attention: Appears intact Alternating Attention: Appears intact Memory: Impaired Memory Impairment: Storage deficit;Decreased short term memory Decreased Short Term Memory: Verbal basic Awareness: Impaired Awareness Impairment: Intellectual impairment Problem Solving: Impaired Problem Solving Impairment: Verbal basic       Comprehension  Auditory Comprehension Overall Auditory Comprehension: Appears within functional limits for tasks assessed    Expression Verbal Expression Overall Verbal Expression: Appears within functional limits for tasks assessed Written Expression Dominant Hand: Right   Oral / Motor  Oral Motor/Sensory Function Overall Oral Motor/Sensory Function: Within functional limits Motor Speech Overall Motor Speech: Appears within functional limits for tasks assessed   GO                   [name], MA CCC-SLP  Acute Rehabilitation Services Pager  [phone] Office  [phone]  [name] 03/22/2019, 10:24 AM

## 2019-03-22 NOTE — Progress Notes (Signed)
CSW acknowledges SNF consult, pending PT/OT recs at this time.   Viveka Wilmeth, LCSW [phone]  

## 2019-03-22 NOTE — Procedures (Signed)
Cortrak  Tube Type:  Cortrak - 43 inches Tube Location:  Left nare Initial Placement:  Stomach Secured by: Bridle Technique Used to Measure Tube Placement:  Documented cm marking at nare/ corner of mouth Cortrak Secured At:  65 cm    Cortrak Tube Team Note:  Consult received to place a Cortrak feeding tube.   No x-ray is required. RN may begin using tube.   If the tube becomes dislodged please keep the tube and contact the Cortrak team at [email or web address] (password [email or web address]) for replacement.  If after hours and replacement cannot be delayed, place a NG tube and confirm placement with an abdominal x-ray.    Cymone Yeske MS, RD, LDN Pager #- [phone] Office#- [phone] After Hours Pager: [phone]   

## 2019-03-22 NOTE — Progress Notes (Signed)
Modified Barium Swallow Progress Note  Patient Details  Name: Wilder Zeni MRN: [id number] Date of Birth: 05/08/1940  Today's Date: 03/22/2019  Modified Barium Swallow completed.  Full report located under Chart Review in the Imaging Section.  Brief recommendations include the following:  Clinical Impression  Pt demonstrates a severe oropharyngeal dysphagia characterized by decreased mobility of the pharyngeal musculature for airway protection and pharyngeal peristalsis. Pt has very limited epiglottic deflection and reduced closure of the laryngeal vestibule during the swallow. There is also very poor peristalsis in the lower pharyngeal constrictors. As a result, pt silently aspirates during and after the swallow with all liquid textures, regardless of bolus size. Attempted positinoal strategies, efforful swallows and protective coughing. Head turns left and right decrease residue. Hard cough and effortful swallow with honey thick liquid swallows also mostly eject aspirate/penetrate. Pt however has poor comprehension of the situation despite teaching and visual feedback. His ability to follow compensatory strategies to safely consume PO without exetensive teaching would be poor. Recommend short term means of nutrition with intensive acute therapeutic interventions for compensatory strategies and education. Pt can likely transition into a soft solid and honey thick diet prior to d/c with interventions. F/u therapy for strengtheing would also be beneficial. Strongly suspect dysphagia is a result of pt history of radiation to the head and neck combined with deconditioning. Discussed with MD.    Ricka Burdock Evaluation Recommendations       SLP Diet Recommendations: NPO;Alternative means - temporary                       Oral Care Recommendations: Oral care QID   Other Recommendations: Have oral suction available    Ivalene Platte, Katherene Ponto 03/22/2019,12:42 PM

## 2019-03-22 NOTE — Progress Notes (Signed)
Hospitalist progress note + chart summary  Phillip Owens [id number] DOB: [birth year] DOA: 03/21/2019  PCP: Patient, No Pcp Per  Narrative:  31 community dwelling black male severe COPD 4 L smoker OSA lung nodule 7.9 mm diverticulosis, internal hemorrhoids with hematochezia 03/2018 + capsule  Found unresponsive at home 12/4-code stroke-right eye deviation mild leukocytosis Covid negative PCO2 70 on ABG UDS + opiates  Data Reviewed:  BUN-baseline/creatinine on admit 27/1.5 down to 25/1.5-baseline unknown Potassium 6.4 on admission down to 4.5-CO2 is 38 up from 31 AST ALT up from 490/4 57-12 11/865 Lactic acid down from 2.5-1.5 Hemoglobin down from 12.9-10.9 White count down from 12.5-8.5 CT negative for acute infarct, MRI 12/4 multiple small acute infarcts bilateral cerebellum punctate foci bilateral basal ganglia?  Small vessel disease Abdominal x-ray negative, chest x-ray 1 view no active diseas EEG performed 12/3 diffuse encephalopathy no seizures   Assessment & Plan: Toxic metabolic encephalopathy-combination CO2 narcosis strokes-resolved patient mentating fairly well Bilateral multiple acute infarcts cerebellum basal ganglia--Plavix 75 daily aspirin 81 daily, change lovastatin--& Lipitor 40 daily-Loop recorder to be placed next week-we will need skilled care placement Mandibular cancer status post surgery many years ago-hopeful with dysphagia new stroke impacting-discussed with speech therapist who feels may benefit from cortrackfeeds temporarily and they will keep following hopefully can graduate to a dysphagia diet in the next several days. AKI/hyperkalemia on admission currently BUN/creatinine down, bicarb up because was given bicarb ampule as treatment for hyperkalemia-trend labs a.m.-Place on saline 50 cc/h Transaminitis-etiology unclear could be secondary to unresponsiveness and is actually trending up-bilirubin however is normal-if worsens will obtain hepatitis serologies etc., get INR in  a.m. COPD stage IV severe 4 L of oxygen at baseline OSA not on CPAP-continue oxygen-monitor trend. Narrow angle glaucoma Gout-continue allopurinol  CHART REVIEW . Hospitalized 1129-12/1 acute hypoxic respiratory failure + fall-Rx doxycycline went home with home health-x-rays showed arthropathy shoulder, BUN/creatinine . Nuclear stress 08/2017 was negative . 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    Subjective: Awake alert coherent making sense but cannot tell me details of past couple days no pain Consultants:   Neurology  Objective: Vitals:   03/21/19 2216 03/22/19 0124 03/22/19 0531 03/22/19 0731  BP:  (!) 160/77 111/63 (!) 147/72  Pulse:  98 84 (!) 101  Resp:  20 20 20   Temp:  98.5 F (36.9 C) (!) 97.3 F (36.3 C) 98.9 F (37.2 C)  TempSrc:  Oral Oral Oral  SpO2: 91% 95% 94% 97%  Weight:   100.9 kg   Height:        Intake/Output Summary (Last 24 hours) at 03/22/2019 0733 Last data filed at 03/22/2019 [id number] Gross per 24 hour  Intake 1155.24 ml  Output 1100 ml  Net 55.24 ml   Filed Weights   03/21/19 2150 03/22/19 0531  Weight: 102 kg 100.9 kg    Examination: Awake alert coherent past-pointing is present seems to track well on the left side but has to move abdomen may be a little bit of right-sided hemineglect smile is symmetric to right throat soft supple S1-S2 no murmur rub or gallop Chest clinically clear no added sounds Abdomen soft nontender no rebound Sensory intact left arm is significantly weaker than right unable to raise greater than 30 degrees power 5/5 however psych euthymic  Scheduled Meds: . heparin  5,000 Units Subcutaneous Q8H  . ipratropium-albuterol  3 mL Nebulization QID  . sodium chloride flush  3 mL Intravenous Q12H   Continuous Infusions: . sodium chloride  100 mL/hr (03/22/19 [id number])     LOS: 1 day   Time spent: [place], MD [facility]  03/22/2019, 7:33 AM

## 2019-03-23 LAB — GLUCOSE, CAPILLARY
Glucose-Capillary: 147 mg/dL — ABNORMAL HIGH (ref 70–99)
Glucose-Capillary: 153 mg/dL — ABNORMAL HIGH (ref 70–99)
Glucose-Capillary: 150 mg/dL — ABNORMAL HIGH (ref 70–99)
Glucose-Capillary: 159 mg/dL — ABNORMAL HIGH (ref 70–99)
Glucose-Capillary: 136 mg/dL — ABNORMAL HIGH (ref 70–99)
Glucose-Capillary: 176 mg/dL — ABNORMAL HIGH (ref 70–99)

## 2019-03-23 LAB — COMPREHENSIVE METABOLIC PANEL
ALT: 825 U/L — ABNORMAL HIGH (ref 0–44)
ALT: 870 U/L — ABNORMAL HIGH (ref 0–44)
AST: 707 U/L — ABNORMAL HIGH (ref 15–41)
AST: 639 U/L — ABNORMAL HIGH (ref 15–41)
Albumin: 2.7 g/dL — ABNORMAL LOW (ref 3.5–5.0)
Albumin: 3 g/dL — ABNORMAL LOW (ref 3.5–5.0)
Alkaline Phosphatase: 76 U/L (ref 38–126)
Alkaline Phosphatase: 90 U/L (ref 38–126)
Anion gap: 11 (ref 5–15)
Anion gap: 9 (ref 5–15)
BUN: 19 mg/dL (ref 8–23)
BUN: 15 mg/dL (ref 8–23)
CO2: 34 mmol/L — ABNORMAL HIGH (ref 22–32)
CO2: 37 mmol/L — ABNORMAL HIGH (ref 22–32)
Calcium: 8.5 mg/dL — ABNORMAL LOW (ref 8.9–10.3)
Calcium: 9 mg/dL (ref 8.9–10.3)
Chloride: 95 mmol/L — ABNORMAL LOW (ref 98–111)
Chloride: 92 mmol/L — ABNORMAL LOW (ref 98–111)
Creatinine, Ser: 1.17 mg/dL (ref 0.61–1.24)
Creatinine, Ser: 1.11 mg/dL (ref 0.61–1.24)
GFR calc Af Amer: >60 mL/min (ref >60)
GFR calc Af Amer: >60 mL/min (ref >60)
GFR calc non Af Amer: 60 mL/min — ABNORMAL LOW (ref >60)
GFR calc non Af Amer: >60 mL/min (ref >60)
Glucose, Bld: 168 mg/dL — ABNORMAL HIGH (ref 70–99)
Glucose, Bld: 164 mg/dL — ABNORMAL HIGH (ref 70–99)
Potassium: 4.5 mmol/L (ref 3.5–5.1)
Potassium: 4.4 mmol/L (ref 3.5–5.1)
Sodium: 140 mmol/L (ref 135–145)
Sodium: 138 mmol/L (ref 135–145)
Total Bilirubin: 0.8 mg/dL (ref 0.3–1.2)
Total Bilirubin: 0.4 mg/dL (ref 0.3–1.2)
Total Protein: 7.1 g/dL (ref 6.5–8.1)
Total Protein: 7.6 g/dL (ref 6.5–8.1)

## 2019-03-23 LAB — CBC WITH DIFFERENTIAL/PLATELET
Abs Immature Granulocytes: 0.03 10*3/uL (ref 0.00–0.07)
Basophils Absolute: 0 10*3/uL (ref 0.0–0.1)
Basophils Relative: 0 %
Eosinophils Absolute: 0.5 10*3/uL (ref 0.0–0.5)
Eosinophils Relative: 7 %
HCT: 38.1 % — ABNORMAL LOW (ref 39.0–52.0)
Hemoglobin: 11.3 g/dL — ABNORMAL LOW (ref 13.0–17.0)
Immature Granulocytes: 0 %
Lymphocytes Relative: 11 %
Lymphs Abs: 0.8 10*3/uL (ref 0.7–4.0)
MCH: 26.8 pg (ref 26.0–34.0)
MCHC: 29.7 g/dL — ABNORMAL LOW (ref 30.0–36.0)
MCV: 90.3 fL (ref 80.0–100.0)
Monocytes Absolute: 0.5 10*3/uL (ref 0.1–1.0)
Monocytes Relative: 7 %
Neutro Abs: 5.6 10*3/uL (ref 1.7–7.7)
Neutrophils Relative %: 75 %
Platelets: 237 10*3/uL (ref 150–400)
RBC: 4.22 MIL/uL (ref 4.22–5.81)
RDW: 15.7 % — ABNORMAL HIGH (ref 11.5–15.5)
WBC: 7.5 10*3/uL (ref 4.0–10.5)
nRBC: 0.5 % — ABNORMAL HIGH (ref 0.0–0.2)

## 2019-03-23 LAB — PROTIME-INR
INR: 1.2 (ref 0.8–1.2)
Prothrombin Time: 15.5 seconds — ABNORMAL HIGH (ref 11.4–15.2)

## 2019-03-23 MED ORDER — IPRATROPIUM-ALBUTEROL 0.5-2.5 (3) MG/3ML IN SOLN
3.0000 mL | Freq: Two times a day (BID) | RESPIRATORY_TRACT | Status: DC
  Administered 2019-03-23 – 2019-03-24 (×2): 3 mL via RESPIRATORY_TRACT
  Filled 2019-03-23 (×2): qty 3

## 2019-03-23 NOTE — Progress Notes (Signed)
Physical Therapy Treatment Patient Details Name: Phillip Owens MRN: [id number] DOB: 1940/07/19 Today's Date: 03/23/2019    History of Present Illness Patient is a 78 year old male admitted with AMS, aphasia. 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 No associated hemorrhage or mass effect.  Pt also with acute on chronic respiratory failure with hypoxia and hypercapnia, COPD without acute exacerbation:   Patient supposed to be on CPAP at night but was not wearing it.  He was just admitted at [facility] for respiratory failure from 11/29-12/1.     PT Comments    Pt tolerated session well despite desaturating twice with mobility as described below. Pt demonstrates improved functional mobility and is able to tolerate ambulation during session, yet continues to require physical assistance for all mobility at this time. Pt's poor endurance and LE strength place him at a high falls risk. Pt will benefit from continued acute PT services to improve activity tolerance and aide in a return to baseline.   Follow Up Recommendations  SNF;Supervision for mobility/OOB     Equipment Recommendations  (defer to post-acute setting)    Recommendations for Other Services       Precautions / Restrictions Precautions Precautions: Fall Precaution Comments: watch O2/HR Restrictions Weight Bearing Restrictions: No    Mobility  Bed Mobility Overal bed mobility: Needs Assistance Bed Mobility: Supine to Sit     Supine to sit: Min assist;HOB elevated Sit to supine: Min assist;HOB elevated   General bed mobility comments: pt requires minA to scoot up in flat bed  Transfers Overall transfer level: Needs assistance Equipment used: Rolling walker (2 wheeled) Transfers: Sit to/from Stand Sit to Stand: Min assist;From elevated surface            Ambulation/Gait Ambulation/Gait  assistance: Min Web designer (Feet): 20 Feet Assistive device: Rolling walker (2 wheeled) Gait Pattern/deviations: Step-to pattern;Shuffle Gait velocity: reduced Gait velocity interpretation: <1.31 ft/sec, indicative of household ambulator General Gait Details: pt with short step to gait with reduced step length and foot clearance. Pt with decreased stance time on RLE during conclusion of ambulation   Stairs             Wheelchair Mobility    Modified Rankin (Stroke Patients Only) Modified Rankin (Stroke Patients Only) Pre-Morbid Rankin Score: Slight disability Modified Rankin: Moderately severe disability     Balance Overall balance assessment: Needs assistance Sitting-balance support: Bilateral upper extremity supported;Feet supported Sitting balance-Leahy Scale: Good Sitting balance - Comments: close supervision   Standing balance support: Bilateral upper extremity supported Standing balance-Leahy Scale: Fair Standing balance comment: minA with BUe support of RW                            Cognition Arousal/Alertness: Awake/alert Behavior During Therapy: WFL for tasks assessed/performed Overall Cognitive Status: Impaired/Different from baseline                       Memory: Decreased short-term memory   Safety/Judgement: Decreased awareness of safety;Decreased awareness of deficits            Exercises      General Comments General comments (skin integrity, edema, etc.): SpO2 dropping to 80% during bed mobility on 3L [place], PT increases supplemental oxygen to 4L [place] with increase in saturation to low 90s, pt desaturates during ambulation on  4L [place] to 86%, recovering to 93% once resting in seated position. Pt returned to 3L [place] when resting in supine at end of session      Pertinent Vitals/Pain Pain Assessment: No/denies pain    Home Living                      Prior Function            PT Goals (current goals can now  be found in the care plan section) Acute Rehab PT Goals Patient Stated Goal: to eat something Progress towards PT goals: Progressing toward goals    Frequency    Min 3X/week      PT Plan Current plan remains appropriate    Co-evaluation              AM-PAC PT "6 Clicks" Mobility   Outcome Measure  Help needed turning from your back to your side while in a flat bed without using bedrails?: A Little Help needed moving from lying on your back to sitting on the side of a flat bed without using bedrails?: A Little Help needed moving to and from a bed to a chair (including a wheelchair)?: A Little Help needed standing up from a chair using your arms (e.g., wheelchair or bedside chair)?: A Little Help needed to walk in hospital room?: A Little Help needed climbing 3-5 steps with a railing? : Total 6 Click Score: 16    End of Session Equipment Utilized During Treatment: Gait belt;Oxygen Activity Tolerance: Patient tolerated treatment well Patient left: in bed;with call bell/phone within reach;with bed alarm set Nurse Communication: Mobility status PT Visit Diagnosis: Muscle weakness (generalized) (M62.81);Difficulty in walking, not elsewhere classified (R26.2);Dizziness and giddiness (R42)     Time: 1546-1610 PT Time Calculation (min) (ACUTE ONLY): 24 min  Charges:  $Gait Training: 8-22 mins $Therapeutic Activity: 8-22 mins                     Zenaida Niece, PT, DPT Acute Rehabilitation Pager: [phone]    Zenaida Niece 03/23/2019, 5:36 PM

## 2019-03-23 NOTE — Plan of Care (Signed)

## 2019-03-23 NOTE — Progress Notes (Signed)
Hospitalist progress note + chart summary  Abad Edge [id number] DOB: 1940-05-13 DOA: 03/21/2019  PCP: Patient, No Pcp Per  Narrative:  72 community dwelling black male severe COPD 4 L smoker OSA lung nodule 7.9 mm diverticulosis, internal hemorrhoids with hematochezia 03/2018 + capsule  Found unresponsive at home 12/4-code stroke-right eye deviation mild leukocytosis Covid negative PCO2 70 on ABG UDS + opiates  Data Reviewed:  BUN-baseline/creatinine on admit 27/1.5-->19/1.1 Potassium 6.4  4.5-CO2 is 38--34 AST ALT 1211/865-->707/825 Lactic acid down from 2.5-1.5 Hemoglobin down from 12.9-10.9 -->11.3, INR 1.2 White count down from 12.5-8.5 RAD CT negative for acute infarct, MRI 12/4 multiple small acute infarcts bilateral cerebellum punctate foci bilateral basal ganglia?  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 Plavix 75 daily aspirin 81 daily, change lovastatin--& Lipitor 40 daily-Loop recorder to be placed next week-we will need skilled care placement Mandibular cancer status post surgery many years ago Dysphagia from new stroke impacting-SLP felt cortrack feeds temporarily- ?graduate to a dysphagia diet  AKI/hyperkalemia on admission currently BUN/creatinine down trend labs a.m.-Place on saline 50 cc/h-Labs resolving slowly Transaminitis etiology unclear could be secondary to unresponsiveness and is actually trending up-bilirubin however is normal-get hepatitis serologies 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 Narrow angle glaucoma Gout-continue allopurinol  CHART REVIEW . Hospitalized 1129-12/1  acute hypoxic respiratory failure + fall-Rx doxycycline went home with home health-x-rays showed arthropathy shoulder, BUN/creatinine . Nuclear stress 08/2017 was negative . 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  Subjective:  Awake alert Surprised to know he had a stroke-although we mentioned this yesterday No fever no chills  Had some CP earlier this am-EKG reviewed as above-seemed to reoslve with belching  Consultants:   Neurology  Objective: Vitals:   03/23/19 0011 03/23/19 0447 03/23/19 0713 03/23/19 0757  BP: (!) 141/77 (!) 149/80  (!) 154/77  Pulse: 98 91  98  Resp: 20 20  20   Temp: 98.6 F (37 C) 98.1 F (36.7 C)  98.5 F (36.9 C)  TempSrc: Oral Oral  Oral  SpO2: 98% 95% 93% 94%  Weight:  101.2 kg    Height:        Intake/Output Summary (Last 24 hours) at 03/23/2019 1026 Last data filed at 03/23/2019 [id number] Gross per 24 hour  Intake 1242.33 ml  Output 1800 ml  Net -557.67 ml   Filed Weights   03/21/19 2150 03/22/19 0531 03/23/19 0447  Weight: 102 kg 100.9 kg 101.2 kg    Examination: Awake alert coherent past-pointing is present  Cortrack now in place no other focal deficit S1-S2 no murmur rub or gallop Chest clinically clear no added sounds Abdomen soft nontender no rebound no gaurd Sensory intact left arm is significantly weaker than right unable to raise greater than 30 degrees power 5/5 however psych euthymic Flat R NL fold  Scheduled Meds: . allopurinol  300 mg Oral Daily  . aspirin EC  81 mg Oral Daily  . atorvastatin  40 mg Oral q1800  . brimonidine  1 drop Both Eyes BID  . clopidogrel  75 mg Oral Daily  . dorzolamide  1 drop Both Eyes BID  . feeding supplement (PRO-STAT SUGAR FREE 64)  30 mL Per Tube BID  . ferrous sulfate  325 mg Oral Q breakfast  . fluticasone furoate-vilanterol  1 puff Inhalation Daily   And  . umeclidinium bromide  1 puff Inhalation Daily  . heparin  5,000 Units Subcutaneous Q8H  . ipratropium-albuterol  3 mL  Nebulization Q12H  . metoprolol tartrate  25 mg Oral Daily  . pantoprazole  40 mg Oral Daily  . sodium chloride flush  3 mL Intravenous Q12H   Continuous Infusions: . sodium chloride 50 mL/hr at 03/22/19 2041  . feeding supplement (OSMOLITE 1.5 CAL) 35 mL/hr at 03/23/19 0030     LOS: 2 days   Time spent: [place], MD [facility]  03/23/2019, 10:26 AM

## 2019-03-24 LAB — COMPREHENSIVE METABOLIC PANEL
ALT: 621 U/L — ABNORMAL HIGH (ref 0–44)
AST: 327 U/L — ABNORMAL HIGH (ref 15–41)
Albumin: 2.6 g/dL — ABNORMAL LOW (ref 3.5–5.0)
Alkaline Phosphatase: 86 U/L (ref 38–126)
Anion gap: 11 (ref 5–15)
BUN: 17 mg/dL (ref 8–23)
CO2: 34 mmol/L — ABNORMAL HIGH (ref 22–32)
Calcium: 8.9 mg/dL (ref 8.9–10.3)
Chloride: 93 mmol/L — ABNORMAL LOW (ref 98–111)
Creatinine, Ser: 1.01 mg/dL (ref 0.61–1.24)
GFR calc Af Amer: >60 mL/min (ref >60)
GFR calc non Af Amer: >60 mL/min (ref >60)
Glucose, Bld: 167 mg/dL — ABNORMAL HIGH (ref 70–99)
Potassium: 4.9 mmol/L (ref 3.5–5.1)
Sodium: 138 mmol/L (ref 135–145)
Total Bilirubin: 0.3 mg/dL (ref 0.3–1.2)
Total Protein: 7 g/dL (ref 6.5–8.1)

## 2019-03-24 LAB — GLUCOSE, CAPILLARY
Glucose-Capillary: 139 mg/dL — ABNORMAL HIGH (ref 70–99)
Glucose-Capillary: 161 mg/dL — ABNORMAL HIGH (ref 70–99)
Glucose-Capillary: 169 mg/dL — ABNORMAL HIGH (ref 70–99)
Glucose-Capillary: 169 mg/dL — ABNORMAL HIGH (ref 70–99)
Glucose-Capillary: 172 mg/dL — ABNORMAL HIGH (ref 70–99)
Glucose-Capillary: 215 mg/dL — ABNORMAL HIGH (ref 70–99)

## 2019-03-24 LAB — HEPATITIS PANEL, ACUTE
HCV Ab: NONREACTIVE
Hep A IgM: NONREACTIVE
Hep B C IgM: NONREACTIVE
Hepatitis B Surface Ag: NONREACTIVE

## 2019-03-24 MED ORDER — MELATONIN 3 MG PO TABS
3.0000 mg | ORAL_TABLET | Freq: Once | ORAL | Status: AC
  Administered 2019-03-24: 3 mg via ORAL
  Filled 2019-03-24: qty 1

## 2019-03-24 MED ORDER — IPRATROPIUM-ALBUTEROL 0.5-2.5 (3) MG/3ML IN SOLN
3.0000 mL | Freq: Two times a day (BID) | RESPIRATORY_TRACT | Status: DC
  Administered 2019-03-24 – 2019-03-29 (×10): 3 mL via RESPIRATORY_TRACT
  Filled 2019-03-24 (×10): qty 3

## 2019-03-24 MED ORDER — LACTULOSE 10 GM/15ML PO SOLN
20.0000 g | Freq: Every day | ORAL | Status: DC
  Administered 2019-03-24 – 2019-03-29 (×6): 20 g via ORAL
  Filled 2019-03-24 (×6): qty 30

## 2019-03-24 MED ORDER — PNEUMOCOCCAL VAC POLYVALENT 25 MCG/0.5ML IJ INJ
0.5000 mL | INJECTION | INTRAMUSCULAR | Status: AC
  Administered 2019-03-27: 0.5 mL via INTRAMUSCULAR
  Filled 2019-03-24: qty 0.5

## 2019-03-24 NOTE — TOC Progression Note (Signed)
Transition of Care [facility]) - Progression Note    Patient Details  Name: Destined Gariepy MRN: [id number] Date of Birth: 07-30-1940  [facility] of [facility] CM/SW [place] Phone Number: [phone] 03/24/2019, 11:33 AM  Clinical Narrative:     CSW followed up with patient's wife Bonnita Nasuti in regards to the 3 bed offers that have come back. Bonnita Nasuti requested that [facility] fax referral out to [facility] and did not want to accept any of the other offers at this time.  CSW attempted to call facility in order for them to look at referral. CSW had to leave message.   TOC team will continue to follow for discharge planningaoI Expected Discharge Plan: Skilled Nursing Facility Barriers to Discharge: Continued Medical Work up  Expected Discharge Plan and Services Expected Discharge Plan: [place] Choice: [place] arrangements for the past 2 months: Single Family Home                                       Social Determinants of Health (SDOH) Interventions    Readmission Risk Interventions No flowsheet data found.

## 2019-03-24 NOTE — Progress Notes (Signed)
Patient complaining of fullness, constipation and nausea. Continued left side rib pain and asking for pain relief. Will pause NG tube feeding to help relieve symptoms and assist patient to bedside commode.

## 2019-03-24 NOTE — Plan of Care (Signed)

## 2019-03-24 NOTE — Progress Notes (Signed)
  Speech Language Pathology Treatment: Dysphagia  Patient Details Name: Phillip Owens MRN: [id number] DOB: January 20, 1941 Today's Date: 03/24/2019 Time: 1340-1405 SLP Time Calculation (min) (ACUTE ONLY): 25 min  Assessment / Plan / Recommendation Clinical Impression  Patient was seen to address severe oropharyngeal dysphagia. Pt began a diet ordered by nursing on Saturday 03/23/19. Patient, his wife and RN report pt is tolerating diet well. Pt currently has an NG present with TF for nutrition. Observed patient with puree and honey thick liquids by tsp and cup. He exhibited no wet vocal quality having pt say /a/ after each sip/bite. No coughing or throat clearing spontaneously or after /a/. No changes to voice after subsequent sips/bites. Pt reported not eating much at each meal, but enjoyed the magic cup. Instructed pt on [facility] as well as tongue hold exercises increase base of tongue and pharyngeal strength. Education with pt and wife on the importance of aspiration precautions, good oral care and exercises to increase motility and strength of swallow. ST to continue to follow for further therapy, diet tolerance and possible follow up [facility].   HPI HPI: Pt admitted with AMS, aphasia. 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 No associated hemorrhage or mass effect.  Pt also with acute on chronic respiratory failure with hypoxia and hypercapnia, COPD without acute exacerbation:   Patient supposed to be on CPAP at night but was not wearing it.  He was just admitted at [facility] for respiratory failure from 11/29-12/1.  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       SLP Plan  Continue with  current plan of care       Recommendations  Diet recommendations: Dysphagia 1 (puree);Honey-thick liquid Liquids provided via: Teaspoon;Cup;No straw Medication Administration: Whole meds with liquid Supervision: Patient able to self feed;Intermittent supervision to cue for compensatory strategies Compensations: Minimize environmental distractions;Slow rate;Small sips/bites;Effortful swallow Postural Changes and/or Swallow Maneuvers: Upright 30-60 min after meal;Seated upright 90 degrees                SLP Visit Diagnosis: Dysphagia, oropharyngeal phase (R13.12) Plan: Continue with current plan of care       [place], MA, CCC-SLP 03/24/2019, 2:37 PM

## 2019-03-24 NOTE — Plan of Care (Signed)
  Problem: Activity: Goal: Risk for activity intolerance will decrease Outcome: Progressing Note: Patient able to get up to [facility] and to walk ~15 feet in hallway with front wheel walker. Oxygen demand increases with activity   Problem: Elimination: Goal: Will not experience complications related to bowel motility Outcome: Progressing Note: Successful BM after dose of lactulose   Problem: Safety: Goal: Ability to remain free from injury will improve Outcome: Progressing Note: Patient Aox4 and appropriate for asking for help

## 2019-03-24 NOTE — Progress Notes (Signed)
Hospitalist progress note + chart summary  Phillip Owens [id number] DOB: 12-Aug-1940 DOA: 03/21/2019  PCP: Patient, No Pcp Per  Narrative:  21 community dwelling black male severe COPD 4 L smoker OSA lung nodule 7.9 mm diverticulosis, internal hemorrhoids with hematochezia 03/2018 + capsule  Found unresponsive at home 12/4-code stroke-right eye deviation mild leukocytosis Covid negative PCO2 70 on ABG UDS + opiates  Data Reviewed:  BUN-baseline/creatinine on admit 27/1.5-->19/1.1 Potassium 6.4  4.5-CO2 is 38--34 AST ALT 1211/865-->707/825-->327/621 Hemoglobin down from 12.9-11.3 -->11.3 White count down from 12.5-7.5 RAD CT negative for acute infarct, MRI 12/4 multiple small acute infarcts bilateral cerebellum punctate foci bilateral basal ganglia?  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  Narrow angle glaucoma Gout-continue allopurinol  CHART REVIEW . Hospitalized 1129-12/1 acute hypoxic respiratory failure + fall-Rx doxycycline went home with home health-x-rays showed  arthropathy shoulder, BUN/creatinine . Nuclear stress 08/2017 was negative . 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  Subjective: Awake pleasant alert in nad no focal deficit other than mild facial asymm getting feed in tube Some L sided CP persists  Consultants:   Neurology  Objective: Vitals:   03/24/19 0017 03/24/19 0416 03/24/19 0750 03/24/19 0853  BP:  140/77 (!) 153/80   Pulse:  94 (!) 101   Resp:  20 18   Temp:  99.2 F (37.3 C) 98 F (36.7 C)   TempSrc:  Oral    SpO2:  94% 99% 98%  Weight: 100.2 kg     Height:        Intake/Output Summary (Last 24 hours) at 03/24/2019 1023 Last data filed at 03/24/2019 [id number] Gross per 24 hour  Intake 2850.92 ml  Output 2875 ml  Net -24.08 ml   Filed Weights   03/22/19 0531 03/23/19 0447 03/24/19 0017  Weight: 100.9 kg 101.2 kg 100.2 kg    Examination:  eomi cna tno focal deficit no actue distress  Mild flattened R NL fold Ct ab no adde dosund Power good exceptfor long time deficit LUE No le edema  Scheduled Meds: . allopurinol  300 mg Oral Daily  . aspirin EC  81 mg Oral Daily  . atorvastatin  40 mg Oral q1800  . brimonidine  1 drop Both Eyes BID  . clopidogrel  75 mg Oral Daily  . dorzolamide  1 drop Both Eyes BID  . feeding supplement (PRO-STAT SUGAR FREE 64)  30 mL Per Tube BID  . ferrous sulfate  325 mg Oral Q breakfast  . fluticasone furoate-vilanterol  1 puff Inhalation Daily   And  . umeclidinium bromide  1 puff Inhalation Daily  . heparin  5,000 Units Subcutaneous Q8H  . ipratropium-albuterol  3 mL Nebulization Q12H  .  metoprolol tartrate  25 mg Oral Daily  . pantoprazole  40 mg Oral Daily  . sodium chloride flush  3 mL Intravenous Q12H   Continuous Infusions: . sodium chloride 50 mL/hr at 03/23/19 1632  . feeding supplement (OSMOLITE 1.5 CAL) 55 mL/hr at 03/24/19 0635     LOS: 3 days   Time spent: [place], MD [facility]  03/24/2019, 10:23 AM

## 2019-03-24 NOTE — Progress Notes (Signed)
Patient transferred from 41 to 74, Walked 30 feet and became weaker required wheel chair to the room and 2 assist to Bed current 94 on 5l N/c RT aware and will bring Flutter valve for Congestion.

## 2019-03-24 NOTE — Progress Notes (Signed)
Patient has home CPAP unit with him, however, patient currently has an NG placed.  Will use when patient is able to have NG removed.  No distress noted, will continue to monitor.

## 2019-03-24 NOTE — Progress Notes (Signed)
Pt complains unable to sleep, request benadryl pill, to help, notify provider, follow up new order

## 2019-03-25 ENCOUNTER — Inpatient Hospital Stay (HOSPITAL_COMMUNITY): Payer: Medicare HMO

## 2019-03-25 DIAGNOSIS — I639 Cerebral infarction, unspecified: Secondary | ICD-10-CM

## 2019-03-25 LAB — GLUCOSE, CAPILLARY
Glucose-Capillary: 135 mg/dL — ABNORMAL HIGH (ref 70–99)
Glucose-Capillary: 136 mg/dL — ABNORMAL HIGH (ref 70–99)
Glucose-Capillary: 198 mg/dL — ABNORMAL HIGH (ref 70–99)
Glucose-Capillary: 177 mg/dL — ABNORMAL HIGH (ref 70–99)
Glucose-Capillary: 114 mg/dL — ABNORMAL HIGH (ref 70–99)
Glucose-Capillary: 130 mg/dL — ABNORMAL HIGH (ref 70–99)

## 2019-03-25 MED ORDER — ADULT MULTIVITAMIN W/MINERALS CH
1.0000 | ORAL_TABLET | Freq: Every day | ORAL | Status: DC
  Administered 2019-03-25 – 2019-03-29 (×5): 1 via ORAL
  Filled 2019-03-25 (×5): qty 1

## 2019-03-25 MED ORDER — ACETAMINOPHEN-CODEINE #3 300-30 MG PO TABS
1.0000 | ORAL_TABLET | Freq: Three times a day (TID) | ORAL | Status: DC | PRN
  Administered 2019-03-27 – 2019-03-28 (×4): 1 via ORAL
  Filled 2019-03-25 (×5): qty 1

## 2019-03-25 MED ORDER — Medication
Status: DC
Start: ? — End: 2019-03-25

## 2019-03-25 NOTE — Progress Notes (Signed)
CBG not show in system 135 per jenifer NT

## 2019-03-25 NOTE — Care Management Important Message (Signed)
Important Message  Patient Details  Name: Kalmen Morgret MRN: [id number] Date of Birth: April 30, 1940   Medicare Important Message Given:  Yes     Shelda Altes 03/25/2019, 2:43 PM

## 2019-03-25 NOTE — Plan of Care (Signed)

## 2019-03-25 NOTE — Progress Notes (Signed)
Placed patient on home CPAP unit for the night with oxygen set at 3lpm

## 2019-03-25 NOTE — TOC Progression Note (Signed)
Transition of Care [facility]) - Progression Note    Patient Details  Name: Mohib Bertram MRN: [id number] Date of Birth: 27-Jan-1941  Transition [facility] Care [facility]) CM/SW [place], [place] Phone Number: [phone] 03/25/2019, 1:51 PM  Clinical Narrative:     CSW lvm with patient spouse Bonnita Nasuti to follow up on her preference of [facility]. CS[facility] spoke with [facility] they do not accept [employer] CSW to provide other bed offers to [place] pending call back.   Expected Discharge Plan: [place] Barriers to Discharge: Continued Medical Work up  Expected Discharge Plan and Services Expected Discharge Plan: [place] Choice: [place] arrangements for the past 2 months: Single Family Home                                       Social Determinants of Health (SDOH) Interventions    Readmission Risk Interventions No flowsheet data found.

## 2019-03-25 NOTE — Progress Notes (Signed)
Lower extremity venous has been completed.   Preliminary results in CV Proc.   Abram Sander 03/25/2019 10:03 AM

## 2019-03-25 NOTE — Progress Notes (Addendum)
Hospitalist progress note + chart summary  Phillip Owens [id number] DOB: October 21, 1940 DOA: 03/21/2019  PCP: Patient, No Pcp Per  Narrative:  57 community dwelling black male severe COPD 4 L smoker OSA lung nodule 7.9 mm diverticulosis, internal hemorrhoids with hematochezia 03/2018 + capsule  Found unresponsive at home 12/4-code stroke-right eye deviation mild leukocytosis Covid negative PCO2 70 on ABG UDS + opiates  Data Reviewed:  No labs today RAD CT negative for acute infarct, MRI 12/4 multiple small acute infarcts bilateral cerebellum punctate foci bilateral basal ganglia?  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 Plavix 75 daily aspirin 81 daily, now Lipitor 40 daily-Loop recorder to be placed as per cardiology-LM 12/6 Mr Abagail Kitchens of Cardiology to arrange Looks like getting LE dopplers today  Mandibular cancer status post surgery many years ago Dysphagia from new stroke impacting-SLP felt cortrack feeds temporarily-SLP reviewed patient and felt approp dy1 1 honey thick diet-will remove cortrack  AKI/hyperkalemia on admission currently BUN/creatinine down trend labs a.m.-D/c Saline 12/6  Transaminitis Trending down hepatitis serologies  neg  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  Narrow angle glaucoma Gout-continue allopurinol  No family, lovenox, likely can dc sto SNF in next 10-2 days SNF  CHART REVIEW . Hospitalized 1129-12/1 acute hypoxic respiratory failure + fall-Rx doxycycline went home with home health-x-rays showed arthropathy  shoulder, BUN/creatinine . Nuclear stress 08/2017 was negative . 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  Subjective: Awake copherent ate sausage and eggs this am--no issues with this Wants Cortrak out after my discussion with him  No fever no chills no n/v No complaints abd pain today   Consultants:   Neurology  Objective: Vitals:   03/24/19 2002 03/25/19 0050 03/25/19 0418 03/25/19 0736  BP:  117/65 (!) 118/59 (!) 163/78  Pulse:  83 88 94  Resp:  18 18 18   Temp:  98.8 F (37.1 C) 98.5 F (36.9 C) 98.1 F (36.7 C)  TempSrc:  Oral Oral Oral  SpO2: 99% 98% 95% 100%  Weight:   100.1 kg   Height:        Intake/Output Summary (Last 24 hours) at 03/25/2019 0848 Last data filed at 03/25/2019 0600 Gross per 24 hour  Intake 1409 ml  Output 275 ml  Net 1134 ml   Filed Weights   03/23/19 0447 03/24/19 0017 03/25/19 0418  Weight: 101.2 kg 100.2 kg 100.1 kg    Examination:  eomi ncat no focal deficit in nad moving 4 limbs equally R NL fold weakness is improved cta b no added sound abd soft--slight distension nt nd no rebound no guard Motor as above   Scheduled Meds: . allopurinol  300 mg Oral Daily  . aspirin EC  81 mg Oral Daily  . atorvastatin  40 mg Oral q1800  . brimonidine  1 drop Both Eyes BID  . clopidogrel  75 mg Oral Daily  . dorzolamide  1 drop Both Eyes BID  . feeding supplement (PRO-STAT SUGAR FREE 64)  30 mL Per Tube BID  . ferrous sulfate  325 mg Oral Q breakfast  . fluticasone furoate-vilanterol  1 puff Inhalation Daily  And  . umeclidinium bromide  1 puff Inhalation Daily  . heparin  5,000 Units Subcutaneous Q8H  . ipratropium-albuterol  3 mL Nebulization BID  . lactulose  20 g Oral Daily  . metoprolol tartrate  25 mg Oral Daily  . pantoprazole  40 mg Oral Daily  . pneumococcal 23 valent vaccine  0.5 mL Intramuscular Tomorrow-1000  . sodium chloride flush  3 mL Intravenous Q12H   Continuous Infusions: . feeding supplement (OSMOLITE  1.5 CAL) 1,000 mL (03/25/19 0133)     LOS: 4 days   Time spent: [place], MD [facility]  03/25/2019, 8:48 AM

## 2019-03-25 NOTE — Plan of Care (Signed)
  Problem: Activity: Goal: Risk for activity intolerance will decrease Outcome: Progressing   Problem: Safety: Goal: Ability to remain free from injury will improve Outcome: Progressing   

## 2019-03-25 NOTE — Progress Notes (Signed)
STROKE TEAM PROGRESS NOTE   INTERVAL HISTORY He is stable. He is sitting in bed, . He is awake alert oriented,Can not remember what happened yesterday and why he is in the hospital. MRI showed b/l small infarcts. Loop recorder pending  Vitals:   03/25/19 0050 03/25/19 0418 03/25/19 0720 03/25/19 0736  BP: 117/65 (!) 118/59  (!) 163/78  Pulse: 83 88  94  Resp: 18 18  18   Temp: 98.8 F (37.1 C) 98.5 F (36.9 C)  98.1 F (36.7 C)  TempSrc: Oral Oral  Oral  SpO2: 98% 95% 96% 100%  Weight:  100.1 kg    Height:        CBC:  Recent Labs  Lab 03/21/19 1215  03/22/19 0539 03/23/19 0433  WBC 12.5*  --  8.5 7.5  NEUTROABS 11.1*  --   --  5.6  HGB 12.7*   < > 10.9* 11.3*  HCT 42.8   < > 36.0* 38.1*  MCV 91.5  --  89.3 90.3  PLT 219  --  234 237   < > = values in this interval not displayed.    Basic Metabolic Panel:  Recent Labs  Lab 03/23/19 1102 03/24/19 0501  NA 138 138  K 4.4 4.9  CL 92* 93*  CO2 37* 34*  GLUCOSE 164* 167*  BUN 15 17  CREATININE 1.11 1.01  CALCIUM 9.0 8.9   Lipid Panel:     Component Value Date/Time   CHOL 125 03/22/2019 0539   TRIG 89 03/22/2019 0539   HDL 34 (L) 03/22/2019 0539   CHOLHDL 3.7 03/22/2019 0539   VLDL 18 03/22/2019 0539   LDLCALC 73 03/22/2019 0539   HgbA1c:  Lab Results  Component Value Date   HGBA1C 7.4 (H) 03/22/2019   Urine Drug Screen:     Component Value Date/Time   LABOPIA POSITIVE (A) 03/21/2019 1703   COCAINSCRNUR NONE DETECTED 03/21/2019 1703   LABBENZ NONE DETECTED 03/21/2019 1703   AMPHETMU NONE DETECTED 03/21/2019 1703   THCU NONE DETECTED 03/21/2019 1703   LABBARB NONE DETECTED 03/21/2019 1703    Alcohol Level No results found for: [facility]  IMAGING Vas [place] Lower Extremity Venous (dvt)  Result Date: 03/25/2019  Lower Venous Study Indications: Stroke.  Comparison Study: no prior Performing Technologist: Abram Sander RVS  Examination Guidelines: A complete evaluation includes B-mode imaging, spectral Doppler,  color Doppler, and power Doppler as needed of all accessible portions of each vessel. Bilateral testing is considered an integral part of a complete examination. Limited examinations for reoccurring indications may be performed as noted.  +---------+---------------+---------+-----------+----------+--------------+ RIGHT    CompressibilityPhasicitySpontaneityPropertiesThrombus Aging +---------+---------------+---------+-----------+----------+--------------+ CFV      Full           Yes      Yes                                 +---------+---------------+---------+-----------+----------+--------------+ SFJ      Full                                                        +---------+---------------+---------+-----------+----------+--------------+ FV Prox  Full                                                        +---------+---------------+---------+-----------+----------+--------------+  FV Mid   Full                                                        +---------+---------------+---------+-----------+----------+--------------+ FV DistalFull                                                        +---------+---------------+---------+-----------+----------+--------------+ PFV      Full                                                        +---------+---------------+---------+-----------+----------+--------------+ POP      Full           Yes      Yes                                 +---------+---------------+---------+-----------+----------+--------------+ PTV      Full                                                        +---------+---------------+---------+-----------+----------+--------------+ PERO     Full                                                        +---------+---------------+---------+-----------+----------+--------------+   +---------+---------------+---------+-----------+----------+--------------+ LEFT      CompressibilityPhasicitySpontaneityPropertiesThrombus Aging +---------+---------------+---------+-----------+----------+--------------+ CFV      Full           Yes      Yes                                 +---------+---------------+---------+-----------+----------+--------------+ SFJ      Full                                                        +---------+---------------+---------+-----------+----------+--------------+ FV Prox  Full                                                        +---------+---------------+---------+-----------+----------+--------------+ FV Mid   Full                                                        +---------+---------------+---------+-----------+----------+--------------+  FV DistalFull                                                        +---------+---------------+---------+-----------+----------+--------------+ PFV      Full                                                        +---------+---------------+---------+-----------+----------+--------------+ POP      Full           Yes      Yes                                 +---------+---------------+---------+-----------+----------+--------------+ PTV      Full                                                        +---------+---------------+---------+-----------+----------+--------------+ PERO     Full                                                        +---------+---------------+---------+-----------+----------+--------------+     Summary: Right: There is no evidence of deep vein thrombosis in the lower extremity. No cystic structure found in the popliteal fossa. Left: There is no evidence of deep vein thrombosis in the lower extremity. No cystic structure found in the popliteal fossa.  *See table(s) above for measurements and observations.    Preliminary     PHYSICAL EXAM  Temp:  [98.1 F (36.7 C)-98.8 F (37.1 C)] 98.1 F (36.7 C) (12/07  0736) Pulse Rate:  [83-94] 94 (12/07 0736) Resp:  [18-20] 18 (12/07 0736) BP: (117-163)/(59-78) 163/78 (12/07 0736) SpO2:  [95 %-100 %] 100 % (12/07 0736) Weight:  [100.1 kg] 100.1 kg (12/07 0418)  General - Well nourished, well developed, in no apparent distress.  Ophthalmologic - fundi not visualized due to noncooperation.  Cardiovascular - Regular rhythm and rate.  Mental Status -  Level of arousal and orientation to month, place, and age were intact, however not to year. Language including expression, naming, repetition, comprehension was assessed and found intact.  Cranial Nerves II - XII - II - Visual field intact OU. III, IV, VI - Extraocular movements intact. V - Facial sensation intact bilaterally. VII - right nasolabial fold mild flattening. VIII - Hearing & vestibular intact bilaterally. X - Palate elevates symmetrically. XI - Chin turning & shoulder shrug intact bilaterally. XII - Tongue protrusion intact.  Motor Strength - The patient's strength was symmetrical in all extremities except left shoulder deltoid 3/5 with limited ROM which is chronic from previous shoulder injury 60 years ago.  Bulk was normal and fasciculations were absent.   Motor Tone - Muscle tone was assessed at the neck and appendages and was normal.  Reflexes - The patient's reflexes were  symmetrical in all extremities and he had no pathological reflexes.  Sensory - Light touch, temperature/pinprick were assessed and were symmetrical.    Coordination - The patient had normal movements in the hands with no ataxia or dysmetria.  Tremor was absent.  Gait and Station - deferred.   ASSESSMENT/PLAN Mr. Phillip Owens is a 78 y.o. 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 Synchronized small vessel disease in the setting of hypoxia hypercarbia   CT head no acute abnormality. Small vessel disease.   CTA head & neck B ICA atherosclerosis at bifurcation, siphons. Aortic atherosclerosis.   CT perfusion normal  MRI  Multiple small infarcts, most in B cerebellum, B basal ganglia and R parietal lobe and left splenium. Moderate small vessel disease in pons, R basal ganglia and cerebral white matter stable since 2018  2D Echo pending  EEG no sz  Will recommend loop recorder to rule out afib   before discharge.  LDL 73  HgbA1c 7.4  Heparin 5000 units sq tid for VTE prophylaxis  No antithrombotic prior to admission, now on ASA 81 and plavix 75. Continue DAPT for 3 weeks and then ASA alone  Therapy recommendations:  pending   Disposition:  pending  (lives w/ wife PTA)  Encephalopathy   Pt stroke on MRI can not explain his presenting symptoms  Presented with agitation, combativeness, obtundation, AMS  EEG no seizure   Likely multifactorial - elevated LFTs, hypoxia, hypercarbia, leukocytosis, AKI and hyperkalemia  Much improved now  Continue correcting underlying conditions  AKI  Not quite sure baseline Cre  Cre 2.4->2.3->IVF->1.5  Encourage po intake  Off IVF  Acute on chronic respiratory failure w/ hypoxia COPD w/o exacerbation on home O2  PCO2 70->70  PO2 61->110  On [place]  Much improved  CXR neg  Elevated LFTs   AST 490->1211, ALT 457->865  INR 1.3, Ammonia 23  Total bilirubin normal   Not candidate for statin at this time  Hypertension  Stable . Permissive hypertension (OK if < 220/120) but gradually normalize in 5-7 days . Long-term BP goal normotensive  Hyperlipidemia  Home meds:  No statin  Now with transaminitis, not a candidate for statin at thie  time  LDL 73, goal < 70  Other Stroke Risk Factors  Advanced age  Hx ALCOHOL use, none current  Obesity, Body mass index is 29.93 kg/m., recommend weight loss, diet and exercise as appropriate   Obstructive sleep apnea, on CPAP at home  Other Active Problems  Hyperkalemia K 6.4->4.5  Leukocytosis - WBC 12.5->8.5. B Cx no growth <24h   GERD  Hospital day # 4  I spent  25 minutes in total face-to-face time with the patient, more than 50% of which was spent in counseling and coordination of care, reviewing test results, images and medication, and discussing the diagnosis of stroke, encephalopathy, respiratory failure, AKI, leukocytosis, treatment plan and potential prognosis. This patient's care requiresreview of multiple databases, neurological assessment, discussion with family, other specialists and medical decision making of high complexity. I also discussed with Dr. Verlon Au. Loop recorder today Follow up in stroke clinic in 6 weeks.Stroke team will sign off. Call for questions. Antony Contras, MD Stroke Neurology  03/25/2019 1:24 PM    To contact Stroke Continuity provider, please refer to [email or web address] After hours, contact General Neurology

## 2019-03-25 NOTE — Progress Notes (Addendum)
Nutrition Follow-up  DOCUMENTATION CODES:   Obesity unspecified  INTERVENTION:   -D/c TF orders, as pt no longer has access -2 Magic cups TID with meals, each supplement provides 290 kcal and 9 grams of protein -MVI with minerals daily -RD will follow for diet advancement and adjust supplement regimen as appropriate  NUTRITION DIAGNOSIS:   Inadequate oral intake related to inability to eat as evidenced by NPO status.  Progressing  GOAL:   Patient will meet greater than or equal to 90% of their needs  Progressing   MONITOR:   Diet advancement, Labs, Weight trends, Skin, I & O's  REASON FOR ASSESSMENT:   Other (Comment)    ASSESSMENT:   Phillip Owens is a 78 y.o. 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  Patient was last seen normal at approximately of 2330 last night when he went to bed.  Family found him in the a.m. due to being altered. EMS was called.  On EMS arrival patient was agitated and apparently fighting EMS staff when attempting to move his legs and arms for assessment.  In route it appeared to EMS he was having difficulty comprehending his words and he was answering "yes" to questions.  He was evaluated in the emergency room.  The ER doctor called neuro hospitalist on call about patient being outside the TPA window but being aphasic.  Code stroke activation was advised by the neuro hospitalist..  12/4- s/p BSE- advanced to dysphagia 3 diet with thin liquids; s/p MBSS- recommend NPO via alternative means secondary to severe dysphagia; cortrak tube placed- tip of tube confirmed in stomach 12/6- advanced to dysphagia 1 diet with honey thick liquids 12/7- cortrak d/c due to pt tolerating oral diet  Reviewed I/O's: +584 ml x  24 hours and +1 L since admission  UOP: 825 ml x 24 hours  Pt sitting up in recliner chair, agitated that he was transferred here from bed. Pt reports good appetite and good tolerance of current diet order, however, not eating much. Documented meal completion 5-50%. Observed lunch meal tray- pt consumed 50% of magic cup and meat, as well as 50% of thickened juice. He reports that he would likely eat more, "but that food is nasty". RD explained rationale for current diet order and importance of good meal and supplement intake to promote healing.  Hopeful for improved oral intake with diet advancement. Supplement options limited secondary to honey thick liquids.   Medications reviewed and include lactulose  Labs reviewed: CBGS: 135-198.   Diet Order:   Diet Order            DIET - DYS 1 Room service appropriate? No; Fluid consistency: Honey Thick  Diet effective now              EDUCATION NEEDS:   Not appropriate for education at this time  Skin:  Skin Assessment: Reviewed RN Assessment  Last BM:  03/24/19  Height:   Ht Readings from Last 1 Encounters:  03/21/19 6' (1.829 m)    Weight:   Wt Readings from Last 1 Encounters:  03/25/19 100.1 kg    Ideal Body Weight:  80.9 kg  BMI:  Body mass index is 29.93 kg/m.  Estimated Nutritional Needs:   Kcal:  2000-2200  Protein:  105-120 grams  Fluid:  > 2 L  Saafir Abdullah A. Jimmye Norman, RD, LDN, [place] Registered Dietitian II Certified Diabetes Care and Education Specialist Pager: [phone] After hours Pager: [phone]

## 2019-03-26 ENCOUNTER — Encounter (HOSPITAL_COMMUNITY)
Admission: EM | Disposition: A | Discharge status: Skilled Nursing Facility | Payer: Self-pay | Source: Home / Self Care | Attending: Family Medicine

## 2019-03-26 DIAGNOSIS — G9341 Metabolic encephalopathy: Secondary | ICD-10-CM

## 2019-03-26 DIAGNOSIS — I639 Cerebral infarction, unspecified: Secondary | ICD-10-CM

## 2019-03-26 HISTORY — PX: LOOP RECORDER INSERTION: EP1214

## 2019-03-26 LAB — COMPREHENSIVE METABOLIC PANEL
ALT: 314 U/L — ABNORMAL HIGH (ref 0–44)
AST: 98 U/L — ABNORMAL HIGH (ref 15–41)
Albumin: 2.5 g/dL — ABNORMAL LOW (ref 3.5–5.0)
Alkaline Phosphatase: 78 U/L (ref 38–126)
Anion gap: 10 (ref 5–15)
BUN: 37 mg/dL — ABNORMAL HIGH (ref 8–23)
CO2: 36 mmol/L — ABNORMAL HIGH (ref 22–32)
Calcium: 9.1 mg/dL (ref 8.9–10.3)
Chloride: 94 mmol/L — ABNORMAL LOW (ref 98–111)
Creatinine, Ser: 1.38 mg/dL — ABNORMAL HIGH (ref 0.61–1.24)
GFR calc Af Amer: 57 mL/min — ABNORMAL LOW (ref >60)
GFR calc non Af Amer: 49 mL/min — ABNORMAL LOW (ref >60)
Glucose, Bld: 100 mg/dL — ABNORMAL HIGH (ref 70–99)
Potassium: 5.3 mmol/L — ABNORMAL HIGH (ref 3.5–5.1)
Sodium: 140 mmol/L (ref 135–145)
Total Bilirubin: 0.9 mg/dL (ref 0.3–1.2)
Total Protein: 7 g/dL (ref 6.5–8.1)

## 2019-03-26 LAB — GLUCOSE, CAPILLARY
Glucose-Capillary: 117 mg/dL — ABNORMAL HIGH (ref 70–99)
Glucose-Capillary: 134 mg/dL — ABNORMAL HIGH (ref 70–99)
Glucose-Capillary: 180 mg/dL — ABNORMAL HIGH (ref 70–99)
Glucose-Capillary: 85 mg/dL (ref 70–99)
Glucose-Capillary: 90 mg/dL (ref 70–99)

## 2019-03-26 LAB — CULTURE, BLOOD (ROUTINE X 2)
Culture: NO GROWTH
Culture: NO GROWTH

## 2019-03-26 LAB — CBC WITH DIFFERENTIAL/PLATELET
Abs Immature Granulocytes: 0.03 10*3/uL (ref 0.00–0.07)
Basophils Absolute: 0 10*3/uL (ref 0.0–0.1)
Basophils Relative: 0 %
Eosinophils Absolute: 0.5 10*3/uL (ref 0.0–0.5)
Eosinophils Relative: 6 %
HCT: 40.2 % (ref 39.0–52.0)
Hemoglobin: 11.8 g/dL — ABNORMAL LOW (ref 13.0–17.0)
Immature Granulocytes: 0 %
Lymphocytes Relative: 15 %
Lymphs Abs: 1.1 10*3/uL (ref 0.7–4.0)
MCH: 26.7 pg (ref 26.0–34.0)
MCHC: 29.4 g/dL — ABNORMAL LOW (ref 30.0–36.0)
MCV: 91 fL (ref 80.0–100.0)
Monocytes Absolute: 0.9 10*3/uL (ref 0.1–1.0)
Monocytes Relative: 12 %
Neutro Abs: 5 10*3/uL (ref 1.7–7.7)
Neutrophils Relative %: 67 %
Platelets: 270 10*3/uL (ref 150–400)
RBC: 4.42 MIL/uL (ref 4.22–5.81)
RDW: 16.1 % — ABNORMAL HIGH (ref 11.5–15.5)
WBC: 7.5 10*3/uL (ref 4.0–10.5)
nRBC: 0 % (ref 0.0–0.2)

## 2019-03-26 LAB — PHOSPHORUS: Phosphorus: 3.9 mg/dL (ref 2.5–4.6)

## 2019-03-26 LAB — SARS CORONAVIRUS 2 (TAT 6-24 HRS): SARS Coronavirus 2: NEGATIVE

## 2019-03-26 SURGERY — LOOP RECORDER INSERTION

## 2019-03-26 MED ORDER — CLOPIDOGREL BISULFATE 75 MG PO TABS: 75.0000 mg | ORAL_TABLET | Freq: Every day | ORAL | Status: DC

## 2019-03-26 MED ORDER — LIDOCAINE-EPINEPHRINE 1 %-1:100000 IJ SOLN
INTRAMUSCULAR | Status: DC | PRN
  Administered 2019-03-26: 15 mL

## 2019-03-26 MED ORDER — UMECLIDINIUM BROMIDE 62.5 MCG/INH IN AEPB: 1.0000 | INHALATION_SPRAY | Freq: Every day | RESPIRATORY_TRACT | Status: AC

## 2019-03-26 MED ORDER — LIDOCAINE-EPINEPHRINE 1 %-1:100000 IJ SOLN
INTRAMUSCULAR | Status: AC
  Filled 2019-03-26: qty 1

## 2019-03-26 MED ORDER — ATORVASTATIN CALCIUM 40 MG PO TABS: 40.0000 mg | ORAL_TABLET | Freq: Every day | ORAL | Status: AC

## 2019-03-26 MED ORDER — FLUTICASONE FUROATE-VILANTEROL 100-25 MCG/INH IN AEPB: 1.0000 | INHALATION_SPRAY | Freq: Every day | RESPIRATORY_TRACT | Status: AC

## 2019-03-26 MED ORDER — RESOURCE THICKENUP CLEAR PO POWD: 1.0000 | ORAL | Status: AC | PRN

## 2019-03-26 SURGICAL SUPPLY — 2 items
MONITOR REVEAL LINQ II (Prosthesis & Implant Heart) ×3 IMPLANT
PACK LOOP INSERTION (CUSTOM PROCEDURE TRAY) ×3 IMPLANT

## 2019-03-26 NOTE — Progress Notes (Signed)
Occupational Therapy Treatment Patient Details Name: Phillip Owens MRN: [id number] DOB: [birth year] Today's Date: 03/26/2019    History of present illness Patient is a 78 year old male admitted with AMS, aphasia. 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 No associated hemorrhage or mass effect.  Pt also with acute on chronic respiratory failure with hypoxia and hypercapnia, COPD without acute exacerbation:   Patient supposed to be on CPAP at night but was not wearing it.  He was just admitted at [facility] for respiratory failure from 11/29-12/1.    OT comments  Pt making steady progress towards OT goals this session. Pt intially hesitant to participate in OT session, agreeable to sitting EOB only. Pt required MIN A for bed mobility, needing assist to scoot hips to EOB. Once positioned EOB pt agreeable to sit>stand from EOB and limited household functional mobility with RW. MIN guard for functional mobility with RW needing assist mostly to manage lines. DC plan remains appropriate, will continue to follow acutely per POC.    Follow Up Recommendations  SNF;Supervision/Assistance - 24 hour    Equipment Recommendations  Other (comment)(defer to next venue of care)    Recommendations for Other Services      Precautions / Restrictions Precautions Precautions: Fall Precaution Comments: watch O2/HR Restrictions Weight Bearing Restrictions: No       Mobility Bed Mobility Overal bed mobility: Needs Assistance Bed Mobility: Supine to Sit     Supine to sit: Min assist;HOB elevated     General bed mobility comments: MINA to scoot hips to EOB with HOB elevated  Transfers Overall transfer level: Needs assistance Equipment used: Rolling walker (2 wheeled) Transfers: Sit to/from Stand Sit to Stand: Mod assist;From elevated surface         General transfer  comment: MODA  to power into standing from elevated surface; initial assist to steady self. cues for hand placement    Balance Overall balance assessment: Needs assistance Sitting-balance support: Bilateral upper extremity supported;Feet supported Sitting balance-Leahy Scale: Good Sitting balance - Comments: close supervision   Standing balance support: Bilateral upper extremity supported Standing balance-Leahy Scale: Poor Standing balance comment: reliant on BUE support                           ADL either performed or assessed with clinical judgement   ADL Overall ADL's : Needs assistance/impaired                         Toilet Transfer: Moderate assistance;RW;Ambulation;Min guard Toilet Transfer Details (indicate cue type and reason): simulated in room via functional mobility; MOD A for initial  sit>stand from elevated surface; Min guard for functional mobility needing cues for RW/ line management         Functional mobility during ADLs: Min guard;Rolling walker General ADL Comments: session focus on functional mobility with RW as precursor to higher level ADLs: pt required MOD A sit>stand from EOB with RW, pt reports fatigue after completing bed mobility needing to sit EOB ~ 18mins before attempting to stand. Pt on 3L throuhgout session with O2 dropping to 83%, O2 increased with PLB technique     Vision Patient Visual Report: Other (comment)(patient reports he cannot see out of his L eye)     Perception     Praxis      Cognition  Arousal/Alertness: Awake/alert Behavior During Therapy: WFL for tasks assessed/performed Overall Cognitive Status: Within Functional Limits for tasks assessed                                 General Comments: appears slow to process but overall [facility]        Exercises     Shoulder Instructions       General Comments SpO2 dropping to 83% on 3L during household distance in room, pt required standing rest  break and PLB technique for pt SpO2 to rebound to >90%    Pertinent Vitals/ Pain       Pain Assessment: No/denies pain  Home Living                                          Prior Functioning/Environment              Frequency  Min 2X/week        Progress Toward Goals  OT Goals(current goals can now be found in the care plan section)  Progress towards OT goals: Progressing toward goals  Acute Rehab OT Goals Patient Stated Goal: to eat something OT Goal Formulation: With patient Time For Goal Achievement: 04/05/19 Potential to Achieve Goals: Good  Plan Discharge plan remains appropriate    Co-evaluation                 AM-PAC OT "6 Clicks" Daily Activity     Outcome Measure   Help from another person eating meals?: A Little   Help from another person toileting, which includes using toliet, bedpan, or urinal?: A Lot Help from another person bathing (including washing, rinsing, drying)?: A Lot Help from another person to put on and taking off regular upper body clothing?: A Little Help from another person to put on and taking off regular lower body clothing?: A Lot 6 Click Score: 12    End of Session Equipment Utilized During Treatment: Gait belt;Rolling walker;Oxygen;Other (comment)(3L O2 via [place])  OT Visit Diagnosis: Unsteadiness on feet (R26.81);History of falling (Z91.81);Muscle weakness (generalized) (M62.81)   Activity Tolerance Patient tolerated treatment well   Patient Left in bed;with bed alarm set;with call bell/phone within reach   Nurse Communication          Time: [id number] OT Time Calculation (min): 19 min  Charges: OT General Charges $OT Visit: 1 Visit OT Treatments $Therapeutic Activity: 8-22 mins  Lanier Clam., COTA/L Acute Rehabilitation Services [phone] [place] 03/26/2019, 4:08 PM

## 2019-03-26 NOTE — Discharge Summary (Signed)
Physician Discharge Summary  Phillip Owens [id number] DOB: 1940/10/27 DOA: 03/21/2019  PCP: Patient, No Pcp Per  Admit date: 03/21/2019 Discharge date: 03/26/2019  Time spent: 35 minutes  Recommendations for Outpatient Follow-up:  1. Recommend eventual placement skilled facility probably on 12/9 2. Hesitate to use medications with propensity to cause sedation given his COPD would not use opiates benzos etc.  3. Recheck in the outpatient setting LFTs to ensure still trending downwards 4. Follow-up with neurology 1 to 2 months as per their protocol  Discharge Diagnoses:  Principal Problem:   Acute metabolic encephalopathy Active Problems:   Acute on chronic respiratory failure with hypoxia and hypercapnia (HCC)   SIRS (systemic inflammatory response syndrome) (HCC)   Aphasia   Falls   COPD (chronic obstructive pulmonary disease) (HCC)   AKI (acute kidney injury) (HCC)   Generalized weakness   OSA (obstructive sleep apnea)   Cerebral embolism with cerebral infarction   Discharge Condition: Improved  Diet recommendation: Heart healthy recommend dysphagia 1 honey thick liquids  Filed Weights   03/24/19 0017 03/25/19 0418 03/26/19 0416  Weight: 100.2 kg 100.1 kg 100.2 kg    78 community dwelling black male severe COPD 4 L smoker OSA lung nodule 7.9 mm diverticulosis, internal hemorrhoids with hematochezia 03/2018 + capsule  Found unresponsive at home 12/4-code stroke-right eye deviation mild leukocytosis Covid negative PCO2 70 on ABG UDS + opiates  Data Reviewed:  No labs today Lower extremity Dopplers -12/7 Loop recorder placed 12/8 Multiple CTs and MRIs RAD CT negative for acute infarct, MRI 12/4 multiple small acute infarcts bilateral cerebellum punctate foci bilateral basal ganglia?  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 Plavix 75 daily aspirin 81 daily, now Lipitor 40 daily-Loop recorder to be placed as per cardiology  Mandibular cancer status post surgery many years ago Dysphagia from new stroke also impacting-SLP felt cortrack feeds temporarily-SLP reviewed patient 12/6 and felt approp dy1 1 honey thick diet-core track fluid was removed and we will need to follow outpatient tolerances of feeds  AKI/hyperkalemia on admission currently BUN/creatinine down trend labs a.m.-D/c Saline 12/6  Transaminitis Trending down hepatitis serologies  neg-likely was secondary to metabolic encephalopathy and stroke would recheck as an outpatient  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  Narrow angle glaucoma Gout-continue allopurinol   CHART REVIEW  Hospitalized 1129-12/1 acute hypoxic respiratory failure + fall-Rx doxycycline went home with home health-x-rays showed arthropathy shoulder, BUN/creatinine  Nuclear stress 08/2017 was negative  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  Discharge Exam: Vitals:   03/26/19 1111 03/26/19 1140  BP: 121/69   Pulse: 81   Resp: 18   Temp: 97.6 F (36.4 C)   SpO2: 100% 96%    General: Awake alert coherent no distress EOMI NCAT no focal deficit mild nasolabial flattening Cardiovascular: S1-S2 no murmur Respiratory: Clinically clear Power is intact on the right side completely right upper extremity however lower extremity and left upper extremity have limitations left upper extremity more so because of prior shoulder surgery   Discharge Instructions   Discharge Instructions    Diet - low sodium heart healthy  Complete by: As directed     Increase activity slowly   Complete by: As directed      Allergies as of 03/26/2019   No Known Allergies     Medication List    STOP taking these medications   doxycycline 100 MG tablet Commonly known as: VIBRA-TABS   zolpidem 10 MG tablet Commonly known as: AMBIEN     TAKE these medications   allopurinol 300 MG tablet Commonly known as: ZYLOPRIM Take 300 mg by mouth daily.   amLODipine 10 MG tablet Commonly known as: NORVASC Take 10 mg by mouth daily.   aspirin EC 81 MG tablet Take 81 mg by mouth daily.   atorvastatin 40 MG tablet Commonly known as: LIPITOR Take 1 tablet (40 mg total) by mouth daily at 6 PM.   brimonidine 0.2 % ophthalmic solution Commonly known as: ALPHAGAN Place 1 drop into both eyes 2 (two) times daily.   cholecalciferol 25 MCG (1000 UT) tablet Commonly known as: VITAMIN D3 Take 1,000 Units by mouth daily.   clopidogrel 75 MG tablet Commonly known as: PLAVIX Take 1 tablet (75 mg total) by mouth daily. Start taking on: March 27, 2019   dorzolamide 2 % ophthalmic solution Commonly known as: TRUSOPT Place 1 drop into both eyes 2 (two) times daily.   ferrous sulfate 325 (65 FE) MG tablet Take 325 mg by mouth daily with breakfast.   fluticasone furoate-vilanterol 100-25 MCG/INH Aepb Commonly known as: BREO ELLIPTA Inhale 1 puff into the lungs daily. Start taking on: March 27, 2019   latanoprost 0.005 % ophthalmic solution Commonly known as: XALATAN Place 1 drop into both eyes at bedtime.   lovastatin 20 MG tablet Commonly known as: MEVACOR Take 20 mg by mouth at bedtime.   metoprolol tartrate 25 MG tablet Commonly known as: LOPRESSOR Take 25 mg by mouth daily.   omeprazole 40 MG capsule Commonly known as: PRILOSEC Take 40 mg by mouth daily.   Resource ThickenUp Clear Powd Take 1 Container by mouth as needed.   Trelegy Ellipta 100-62.5-25 MCG/INH Aepb Generic drug: Fluticasone-Umeclidin-Vilant Take 1 puff by mouth  daily.   umeclidinium bromide 62.5 MCG/INH Aepb Commonly known as: INCRUSE ELLIPTA Inhale 1 puff into the lungs daily. Start taking on: March 27, 2019      No Known Allergies Contact information for after-discharge care    [facility] .   Service: Skilled Nursing Contact information: [address] [place] [phone]               The results of significant diagnostics from this hospitalization (including imaging, microbiology, ancillary and laboratory) are listed below for reference.    Significant Diagnostic Studies: Ct Code Stroke Cta Head W/wo Contrast  Result Date: 03/21/2019 CLINICAL DATA:  Altered mental status.  Aphasia. EXAM: CT ANGIOGRAPHY HEAD AND NECK CT PERFUSION BRAIN TECHNIQUE: Multidetector CT imaging of the head and neck was performed using the standard protocol during bolus administration of intravenous contrast. Multiplanar CT image reconstructions and MIPs were obtained to evaluate the vascular anatomy. Carotid stenosis measurements (when applicable) are obtained utilizing NASCET criteria, using the distal internal carotid diameter as the denominator. Multiphase CT imaging of the brain was performed following IV bolus contrast injection. Subsequent parametric perfusion maps were calculated using RAPID software. CONTRAST:  168mL OMNIPAQUE IOHEXOL 350 MG/ML SOLN COMPARISON:  Head CT earlier same day FINDINGS: CTA NECK FINDINGS Aortic arch: Aortic atherosclerosis. No aneurysm or  dissection. Branching pattern is normal without origin stenosis. Right carotid system: Common carotid artery widely patent to the bifurcation. Calcified plaque at the carotid bifurcation and ICA bulb. No stenosis when compared to the more distal cervical ICA diameter. Some plaque affecting the distal cervical ICA but no stenosis. Left carotid system: Common carotid artery widely patent to the bifurcation. Calcified  plaque at the carotid bifurcation and ICA bulb but no stenosis. Distal cervical ICA shows some plaque but no stenosis. Vertebral arteries: Both vertebral artery origins are widely patent. Both vertebral arteries appear approximately equal in size an widely patent through the cervical region to the foramen magnum. Skeleton: Minimal cervical spondylosis. Other neck: No mass or lymphadenopathy. Upper chest: Emphysema and pulmonary scarring.  No focal lesion. Review of the MIP images confirms the above findings CTA HEAD FINDINGS Anterior circulation: Both internal carotid arteries are patent through the skull base and siphon regions. Atherosclerotic calcification within the carotid siphon regions. Stenosis estimated at 50% on each side. The anterior and middle cerebral vessels are patent without proximal stenosis, aneurysm vascular malformation. No large or medium vessel occlusion is identified. Primary anterior circulation supply to both posterior cerebral arteries. Posterior circulation: Both vertebral arteries are patent through the foramen magnum to the basilar. Basilar artery is small but does not show focal stenosis. Both superior cerebellar arteries show flow. As noted above, both posterior cerebral arteries receive the majority of there supply from the anterior circulation. Venous sinuses: Patent and normal. Anatomic variants: None significant otherwise. Review of the MIP images confirms the above findings CT Brain Perfusion Findings: ASPECTS: 10 CBF (<30%) Volume: 57mL Perfusion (Tmax>6.0s) volume: 77mL Mismatch Volume: 91mL Infarction Location:None present IMPRESSION: Normal CT perfusion study. Atherosclerotic change at both carotid bifurcations but no stenosis. Atherosclerotic change in the carotid siphon regions without stenosis greater than 50%. No intracranial large or medium vessel occlusion or correctable proximal stenosis. Aortic atherosclerosis. These results were communicated to Dr. Rory Percy at 12:59 pmon  12/3/2020by text page via the [facility] messaging system. Electronically Signed   By: Nelson Chimes M.D.   On: 03/21/2019 13:01   Dg Abd 1 View  Result Date: 03/22/2019 CLINICAL DATA:  Transaminitis EXAM: ABDOMEN - 1 VIEW COMPARISON:  None. FINDINGS: The bowel gas pattern is normal. No radio-opaque calculi or other significant radiographic abnormality are seen. IMPRESSION: Negative. Electronically Signed   By: Prudencio Pair M.D.   On: 03/22/2019 00:40   Ct Head Wo Contrast  Result Date: 03/21/2019 CLINICAL DATA:  Altered mental status and aphasia. EXAM: CT HEAD WITHOUT CONTRAST TECHNIQUE: Contiguous axial images were obtained from the base of the skull through the vertex without intravenous contrast. COMPARISON:  None. FINDINGS: Brain: No evidence of acute infarction, hemorrhage, hydrocephalus, extra-axial collection or mass lesion/mass effect. 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 There is mild diffuse low-attenuation within the subcortical and periventricular white matter compatible with chronic microvascular disease. Vascular: No hyperdense vessel or unexpected calcification. Skull: Normal. Negative for fracture or focal lesion. Sinuses/Orbits: Paranasal sinuses are clear. Mastoid air cells are clear. Post op changes involving both orbits noted. Other: None IMPRESSION: 1. No acute intracranial abnormalities. 2. Chronic small vessel ischemic change . Electronically Signed   By: Kerby Moors M.D.   On: 03/21/2019 12:08   Ct Code Stroke Cta Neck W/wo Contrast  Result Date: 03/21/2019 CLINICAL DATA:  Altered mental status.  Aphasia. EXAM: CT ANGIOGRAPHY HEAD AND NECK CT PERFUSION BRAIN TECHNIQUE: Multidetector CT imaging of the head and neck was performed  using the standard protocol during bolus administration of intravenous contrast. Multiplanar CT image reconstructions and MIPs were obtained to evaluate the vascular anatomy. Carotid stenosis measurements (when applicable) are obtained  utilizing NASCET criteria, using the distal internal carotid diameter as the denominator. Multiphase CT imaging of the brain was performed following IV bolus contrast injection. Subsequent parametric perfusion maps were calculated using RAPID software. CONTRAST:  179mL OMNIPAQUE IOHEXOL 350 MG/ML SOLN COMPARISON:  Head CT earlier same day FINDINGS: CTA NECK FINDINGS Aortic arch: Aortic atherosclerosis. No aneurysm or dissection. Branching pattern is normal without origin stenosis. Right carotid system: Common carotid artery widely patent to the bifurcation. Calcified plaque at the carotid bifurcation and ICA bulb. No stenosis when compared to the more distal cervical ICA diameter. Some plaque affecting the distal cervical ICA but no stenosis. Left carotid system: Common carotid artery widely patent to the bifurcation. Calcified plaque at the carotid bifurcation and ICA bulb but no stenosis. Distal cervical ICA shows some plaque but no stenosis. Vertebral arteries: Both vertebral artery origins are widely patent. Both vertebral arteries appear approximately equal in size an widely patent through the cervical region to the foramen magnum. Skeleton: Minimal cervical spondylosis. Other neck: No mass or lymphadenopathy. Upper chest: Emphysema and pulmonary scarring.  No focal lesion. Review of the MIP images confirms the above findings CTA HEAD FINDINGS Anterior circulation: Both internal carotid arteries are patent through the skull base and siphon regions. Atherosclerotic calcification within the carotid siphon regions. Stenosis estimated at 50% on each side. The anterior and middle cerebral vessels are patent without proximal stenosis, aneurysm vascular malformation. No large or medium vessel occlusion is identified. Primary anterior circulation supply to both posterior cerebral arteries. Posterior circulation: Both vertebral arteries are patent through the foramen magnum to the basilar. Basilar artery is small but  does not show focal stenosis. Both superior cerebellar arteries show flow. As noted above, both posterior cerebral arteries receive the majority of there supply from the anterior circulation. Venous sinuses: Patent and normal. Anatomic variants: None significant otherwise. Review of the MIP images confirms the above findings CT Brain Perfusion Findings: ASPECTS: 10 CBF (<30%) Volume: 23mL Perfusion (Tmax>6.0s) volume: 55mL Mismatch Volume: 27mL Infarction Location:None present IMPRESSION: Normal CT perfusion study. Atherosclerotic change at both carotid bifurcations but no stenosis. Atherosclerotic change in the carotid siphon regions without stenosis greater than 50%. No intracranial large or medium vessel occlusion or correctable proximal stenosis. Aortic atherosclerosis. These results were communicated to Dr. Rory Percy at 12:59 pmon 12/3/2020by text page via the [facility] messaging system. Electronically Signed   By: Nelson Chimes M.D.   On: 03/21/2019 13:01   Mr Brain Wo Contrast  Result Date: 03/22/2019 CLINICAL DATA:  78 year old male with altered mental status. EXAM: MRI HEAD WITHOUT CONTRAST TECHNIQUE: Multiplanar, multiecho pulse sequences of the brain and surrounding structures were obtained without intravenous contrast. COMPARISON:  CT head, CTA head and neck and CT Perfusion 03/21/2019. [facility] brain MRI 07/29/2016. FINDINGS: Brain: Patchy and scattered restricted diffusion in the bilateral cerebellar hemispheres slightly greater on the right (series 5, image 57). Sparing of the brainstem. But there is punctate restricted diffusion at the bilateral lentiform/genu of the internal capsules (series 5, image 74). There is also a small area of restricted diffusion in the medial left periatrial white matter near the tail of the left hippocampus on series 5, image 71. No definite hippocampal diffusion abnormality. There is also a tiny cortical focus of restricted diffusion in the superior right parietal  lobe (  series 7, image 44). Associated T2 and FLAIR hyperintensity at the areas of acute involvement. No acute hemorrhage or mass effect. Stable chronic lacunar infarct in the right basal ganglia, right pons. Chronic microhemorrhages in the pons are stable since 2018. Patchy and confluent bilateral cerebral white matter T2 and FLAIR hyperintensity is stable to mildly progressed since 2018. No superimposed midline shift, mass effect, evidence of mass lesion, ventriculomegaly, extra-axial collection or acute intracranial hemorrhage. Cervicomedullary junction and pituitary are within normal limits. Vascular: Major intracranial vascular flow voids are stable since 2018. Skull and upper cervical spine: Negative visible cervical spine. Visualized bone marrow signal is within normal limits. Sinuses/Orbits: Stable and negative. Other: Mastoids remain clear. Visible internal auditory structures appear normal. IMPRESSION: 1. Multiple small acute infarcts, most pronounced in the bilateral cerebellum but also punctate foci in the bilateral basal ganglia, right parietal lobe. No associated hemorrhage or mass effect. 2. The pattern might indicate a recent embolic event. But synchronous small vessel disease is also a possibility (see #3). 3. 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 Electronically Signed   By: Genevie Ann M.D.   On: 03/22/2019 01:13   Ct Code Stroke Cerebral Perfusion With Contrast  Result Date: 03/21/2019 CLINICAL DATA:  Altered mental status.  Aphasia. EXAM: CT ANGIOGRAPHY HEAD AND NECK CT PERFUSION BRAIN TECHNIQUE: Multidetector CT imaging of the head and neck was performed using the standard protocol during bolus administration of intravenous contrast. Multiplanar CT image reconstructions and MIPs were obtained to evaluate the vascular anatomy. Carotid stenosis measurements (when applicable) are obtained  utilizing NASCET criteria, using the distal internal carotid diameter as the denominator. Multiphase CT imaging of the brain was performed following IV bolus contrast injection. Subsequent parametric perfusion maps were calculated using RAPID software. CONTRAST:  177mL OMNIPAQUE IOHEXOL 350 MG/ML SOLN COMPARISON:  Head CT earlier same day FINDINGS: CTA NECK FINDINGS Aortic arch: Aortic atherosclerosis. No aneurysm or dissection. Branching pattern is normal without origin stenosis. Right carotid system: Common carotid artery widely patent to the bifurcation. Calcified plaque at the carotid bifurcation and ICA bulb. No stenosis when compared to the more distal cervical ICA diameter. Some plaque affecting the distal cervical ICA but no stenosis. Left carotid system: Common carotid artery widely patent to the bifurcation. Calcified plaque at the carotid bifurcation and ICA bulb but no stenosis. Distal cervical ICA shows some plaque but no stenosis. Vertebral arteries: Both vertebral artery origins are widely patent. Both vertebral arteries appear approximately equal in size an widely patent through the cervical region to the foramen magnum. Skeleton: Minimal cervical spondylosis. Other neck: No mass or lymphadenopathy. Upper chest: Emphysema and pulmonary scarring.  No focal lesion. Review of the MIP images confirms the above findings CTA HEAD FINDINGS Anterior circulation: Both internal carotid arteries are patent through the skull base and siphon regions. Atherosclerotic calcification within the carotid siphon regions. Stenosis estimated at 50% on each side. The anterior and middle cerebral vessels are patent without proximal stenosis, aneurysm vascular malformation. No large or medium vessel occlusion is identified. Primary anterior circulation supply to both posterior cerebral arteries. Posterior circulation: Both vertebral arteries are patent through the foramen magnum to the basilar. Basilar artery is small but  does not show focal stenosis. Both superior cerebellar arteries show flow. As noted above, both posterior cerebral arteries receive the majority of there supply from the anterior circulation. Venous sinuses: Patent and normal. Anatomic variants:  None significant otherwise. Review of the MIP images confirms the above findings CT Brain Perfusion Findings: ASPECTS: 10 CBF (<30%) Volume: 31mL Perfusion (Tmax>6.0s) volume: 42mL Mismatch Volume: 20mL Infarction Location:None present IMPRESSION: Normal CT perfusion study. Atherosclerotic change at both carotid bifurcations but no stenosis. Atherosclerotic change in the carotid siphon regions without stenosis greater than 50%. No intracranial large or medium vessel occlusion or correctable proximal stenosis. Aortic atherosclerosis. These results were communicated to Dr. Rory Percy at 12:59 pmon 12/3/2020by text page via the [facility] messaging system. Electronically Signed   By: Nelson Chimes M.D.   On: 03/21/2019 13:01   Dg Chest Portable 1 View  Result Date: 03/21/2019 CLINICAL DATA:  Shortness of breath. EXAM: PORTABLE CHEST 1 VIEW COMPARISON:  March 17, 2019. FINDINGS: Stable cardiomediastinal silhouette. No pneumothorax or pleural effusion is noted. Both lungs are clear. The visualized skeletal structures are unremarkable. IMPRESSION: No active disease. Electronically Signed   By: Marijo Conception M.D.   On: 03/21/2019 19:06   Dg Swallowing Func-speech Pathology  Result Date: 03/22/2019 Objective Swallowing Evaluation: Type of Study: MBS-Modified Barium Swallow Study  Patient Details Name: Phillip Owens MRN: [id number] Date of Birth: June 15, 1940 Today's Date: 03/22/2019 Time: SLP Start Time (ACUTE ONLY): 1145 -SLP Stop Time (ACUTE ONLY): 1215 SLP Time Calculation (min) (ACUTE ONLY): 30 min Past Medical History: Past Medical History: Diagnosis Date . Cancer ([place])  . Chronic respiratory failure with hypoxia, on home O2 therapy ([place])  . COPD (chronic obstructive pulmonary disease)  ([place])  . Emphysema (subcutaneous) (surgical) resulting from a procedure  . Essential hypertension  . GERD (gastroesophageal reflux disease)  . Hyperglycemia  . Hyperlipidemia  . Multiple lung nodules on CT  . Obstructive sleep apnea on CPAP  . Oxygen dependent  . Renal insufficiency bilateral  . Sensorineural hearing loss  HPI: Pt admitted with AMS, aphasia. 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 No associated hemorrhage or mass effect.  Pt also with acute on chronic respiratory failure with hypoxia and hypercapnia, COPD without acute exacerbation:   Patient supposed to be on CPAP at night but was not wearing it.  He was just admitted at [facility] for respiratory failure from 11/29-12/1.  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  No data recorded Assessment / Plan / Recommendation CHL IP CLINICAL IMPRESSIONS 03/22/2019 Clinical Impression Pt demonstrates a severe oropharyngeal dysphagia characterized by decreased mobility of the pharyngeal musculature for airway protection and pharyngeal peristalsis. Pt has very limited epiglottic deflection and reduced closure of the laryngeal vestibule during the swallow. There is also very poor peristalsis in the lower pharyngeal constrictors. As a result, pt silently aspirates during and after the swallow with all liquid textures, regardless of bolus size. Attempted positinoal strategies, efforful swallows and protective coughing. Head turns left and right decrease residue. Hard cough and effortful swallow with honey thick liquid swallows also mostly eject aspirate/penetrate. Pt however has poor comprehension of the situation despite teaching and visual feedback. His  ability to follow compensatory strategies to safely consume PO without exetensive teaching would be poor. Recommend short term means of nutrition with intensive acute therapeutic interventions for compensatory strategies and education. Pt can likely transition into a soft solid and honey thick  diet prior to d/c with interventions. F?u therapy for strengtheing would also be beneficial. Strongly suspect dysphagia is a result of pt history of radiation to the head and neck combined with deconditioning. Discussed with MD.  SLP Visit Diagnosis Dysphagia, oropharyngeal phase (R13.12) Attention and concentration deficit following -- Frontal lobe and executive function deficit following -- Impact on safety and function Severe aspiration risk   [facility] IP TREATMENT RECOMMENDATION 03/22/2019 Treatment Recommendations Defer until completion of intrumental exam   No flowsheet data found. [facility] IP DIET RECOMMENDATION 03/22/2019 SLP Diet Recommendations NPO;Alternative means - temporary Liquid Administration via -- Medication Administration -- Compensations -- Postural Changes --   [facility] IP OTHER RECOMMENDATIONS 03/22/2019 Recommended Consults -- Oral Care Recommendations Oral care QID Other Recommendations Have oral suction available   [facility] IP FOLLOW UP RECOMMENDATIONS 03/22/2019 Follow up Recommendations Skilled Nursing facility   [facility] IP FREQUENCY AND DURATION 03/22/2019 Speech Therapy Frequency (ACUTE ONLY) min 2x/week Treatment Duration 2 weeks      [facility] IP ORAL PHASE 03/22/2019 Oral Phase WFL Oral - Pudding Teaspoon -- Oral - Pudding Cup -- Oral - Honey Teaspoon -- Oral - Honey Cup -- Oral - Nectar Teaspoon -- Oral - Nectar Cup -- Oral - Nectar Straw -- Oral - Thin Teaspoon -- Oral - Thin Cup -- Oral - Thin Straw -- Oral - Puree -- Oral - Mech Soft -- Oral - Regular -- Oral - Multi-Consistency -- Oral - Pill -- Oral Phase - Comment --  [facility] IP PHARYNGEAL PHASE 03/22/2019 Pharyngeal Phase Impaired Pharyngeal- Pudding Teaspoon -- Pharyngeal --  Pharyngeal- Pudding Cup -- Pharyngeal -- Pharyngeal- Honey Teaspoon Delayed swallow initiation-vallecula;Reduced epiglottic inversion;Reduced anterior laryngeal mobility;Reduced airway/laryngeal closure;Pharyngeal residue - valleculae;Pharyngeal residue - pyriform;Penetration/Aspiration during swallow;Penetration/Apiration after swallow;Moderate aspiration Pharyngeal Material enters airway, passes BELOW cords without attempt by patient to eject out (silent aspiration);Material enters airway, CONTACTS cords and not ejected out Pharyngeal- Honey Cup -- Pharyngeal -- Pharyngeal- Nectar Teaspoon Delayed swallow initiation-vallecula;Reduced epiglottic inversion;Reduced anterior laryngeal mobility;Reduced airway/laryngeal closure;Pharyngeal residue - valleculae;Pharyngeal residue - pyriform;Penetration/Aspiration during swallow;Penetration/Apiration after swallow;Moderate aspiration Pharyngeal Material enters airway, passes BELOW cords without attempt by patient to eject out (silent aspiration);Material enters airway, CONTACTS cords and not ejected out Pharyngeal- Nectar Cup -- Pharyngeal -- Pharyngeal- Nectar Straw Delayed swallow initiation-vallecula;Reduced epiglottic inversion;Reduced anterior laryngeal mobility;Reduced airway/laryngeal closure;Pharyngeal residue - valleculae;Pharyngeal residue - pyriform;Penetration/Aspiration during swallow;Penetration/Apiration after swallow;Moderate aspiration Pharyngeal Material enters airway, passes BELOW cords without attempt by patient to eject out (silent aspiration);Material enters airway, CONTACTS cords and not ejected out Pharyngeal- Thin Teaspoon -- Pharyngeal -- Pharyngeal- Thin Cup Delayed swallow initiation-vallecula;Reduced epiglottic inversion;Reduced anterior laryngeal mobility;Reduced airway/laryngeal closure;Pharyngeal residue - valleculae;Pharyngeal residue - pyriform;Penetration/Aspiration during swallow;Penetration/Apiration after swallow;Moderate aspiration  Pharyngeal -- Pharyngeal- Thin Straw -- Pharyngeal -- Pharyngeal- Puree Delayed swallow initiation-vallecula;Reduced epiglottic inversion;Reduced anterior laryngeal mobility;Reduced airway/laryngeal closure;Pharyngeal residue - valleculae;Pharyngeal residue - pyriform Pharyngeal -- Pharyngeal- Mechanical Soft -- Pharyngeal -- Pharyngeal- Regular -- Pharyngeal -- Pharyngeal- Multi-consistency -- Pharyngeal -- Pharyngeal- Pill -- Pharyngeal -- Pharyngeal Comment --  No flowsheet data found. [name], MA CCC-SLP Acute Rehabilitation Services Pager [phone] Office [phone] [name] 03/22/2019, 12:48 PM              Vas [place] Lower Extremity Venous (dvt)  Result Date: 03/25/2019  Lower Venous Study Indications: Stroke.  Comparison Study: no prior Performing Technologist: Abram Sander RVS  Examination Guidelines: A complete evaluation includes B-mode imaging, spectral Doppler, color Doppler, and power Doppler as needed of all accessible portions of  each vessel. Bilateral testing is considered an integral part of a complete examination. Limited examinations for reoccurring indications may be performed as noted.  +---------+---------------+---------+-----------+----------+--------------+ RIGHT    CompressibilityPhasicitySpontaneityPropertiesThrombus Aging +---------+---------------+---------+-----------+----------+--------------+ CFV      Full           Yes      Yes                                 +---------+---------------+---------+-----------+----------+--------------+ SFJ      Full                                                        +---------+---------------+---------+-----------+----------+--------------+ FV Prox  Full                                                        +---------+---------------+---------+-----------+----------+--------------+ FV Mid   Full                                                         +---------+---------------+---------+-----------+----------+--------------+ FV DistalFull                                                        +---------+---------------+---------+-----------+----------+--------------+ PFV      Full                                                        +---------+---------------+---------+-----------+----------+--------------+ POP      Full           Yes      Yes                                 +---------+---------------+---------+-----------+----------+--------------+ PTV      Full                                                        +---------+---------------+---------+-----------+----------+--------------+ PERO     Full                                                        +---------+---------------+---------+-----------+----------+--------------+   +---------+---------------+---------+-----------+----------+--------------+ LEFT     CompressibilityPhasicitySpontaneityPropertiesThrombus Aging +---------+---------------+---------+-----------+----------+--------------+ CFV      Full  Yes      Yes                                 +---------+---------------+---------+-----------+----------+--------------+ SFJ      Full                                                        +---------+---------------+---------+-----------+----------+--------------+ FV Prox  Full                                                        +---------+---------------+---------+-----------+----------+--------------+ FV Mid   Full                                                        +---------+---------------+---------+-----------+----------+--------------+ FV DistalFull                                                        +---------+---------------+---------+-----------+----------+--------------+ PFV      Full                                                         +---------+---------------+---------+-----------+----------+--------------+ POP      Full           Yes      Yes                                 +---------+---------------+---------+-----------+----------+--------------+ PTV      Full                                                        +---------+---------------+---------+-----------+----------+--------------+ PERO     Full                                                        +---------+---------------+---------+-----------+----------+--------------+     Summary: Right: There is no evidence of deep vein thrombosis in the lower extremity. No cystic structure found in the popliteal fossa. Left: There is no evidence of deep vein thrombosis in the lower extremity. No cystic structure found in the popliteal fossa.  *See table(s) above for measurements and observations. Electronically signed by Servando Snare MD on 03/25/2019 at 5:04:25 PM.    Final  Microbiology: Recent Results (from the past 240 hour(s))  Culture, blood (routine x 2)     Status: None   Collection Time: 03/21/19  3:16 PM   Specimen: BLOOD RIGHT HAND  Result Value Ref Range Status   Specimen Description BLOOD RIGHT HAND  Final   Special Requests   Final    BOTTLES DRAWN AEROBIC AND ANAEROBIC Blood Culture results may not be optimal due to an inadequate volume of blood received in culture bottles   Culture   Final    NO GROWTH 5 DAYS Performed at [facility], [place] [address] [place], [place] [address]    Report Status 03/26/2019 FINAL  Final  SARS Coronavirus 2 by RT PCR (hospital order, performed in [facility] hospital lab) Nasopharyngeal Nasopharyngeal Swab     Status: None   Collection Time: 03/21/19  3:24 PM   Specimen: Nasopharyngeal Swab  Result Value Ref Range Status   SARS Coronavirus 2 NEGATIVE NEGATIVE Final    Comment: (NOTE) SARS-CoV-2 target nucleic acids are NOT DETECTED. The SARS-CoV-2 RNA is generally detectable in upper and  lower respiratory specimens during the acute phase of infection. The lowest concentration of SARS-CoV-2 viral copies this assay can detect is 250 copies / mL. A negative result does not preclude SARS-CoV-2 infection and should not be used as the sole basis for treatment or other patient management decisions.  A negative result may occur with improper specimen collection / handling, submission of specimen other than nasopharyngeal swab, presence of viral mutation(s) within the areas targeted by this assay, and inadequate number of viral copies (<250 copies / mL). A negative result must be combined with clinical observations, patient history, and epidemiological information. Fact Sheet for Patients:   StrictlyIdeas.no Fact Sheet for Healthcare Providers: BankingDealers.co.za This test is not yet approved or cleared  by the [place] FDA and has been authorized for detection and/or diagnosis of SARS-CoV-2 by FDA under an Emergency Use Authorization (EUA).  This EUA will remain in effect (meaning this test can be used) for the duration of the COVID-19 declaration under Section 564(b)(1) of the Act, 21 U.S.C. section 360bbb-3(b)(1), unless the authorization is terminated or revoked sooner. Performed at [facility], [place] [address] [place], [place] [address]   Culture, blood (routine x 2)     Status: None   Collection Time: 03/21/19  4:05 PM   Specimen: BLOOD LEFT WRIST  Result Value Ref Range Status   Specimen Description BLOOD LEFT WRIST  Final   Special Requests   Final    BOTTLES DRAWN AEROBIC AND ANAEROBIC Blood Culture results may not be optimal due to an inadequate volume of blood received in culture bottles   Culture   Final    NO GROWTH 5 DAYS Performed at [facility], [place] [address] [place], [place] [address]    Report Status 03/26/2019 FINAL  Final  Respiratory Panel by PCR     Status: None   Collection Time:  03/21/19 10:01 PM   Specimen: Nasopharyngeal Swab; Respiratory  Result Value Ref Range Status   Adenovirus NOT DETECTED NOT DETECTED Final   Coronavirus 229E NOT DETECTED NOT DETECTED Final    Comment: (NOTE) The Coronavirus on the Respiratory Panel, DOES NOT test for the novel  Coronavirus (2019 nCoV)    Coronavirus HKU1 NOT DETECTED NOT DETECTED Final   Coronavirus NL63 NOT DETECTED NOT DETECTED Final   Coronavirus OC43 NOT DETECTED NOT DETECTED Final   Metapneumovirus NOT  DETECTED NOT DETECTED Final   Rhinovirus / Enterovirus NOT DETECTED NOT DETECTED Final   Influenza A NOT DETECTED NOT DETECTED Final   Influenza B NOT DETECTED NOT DETECTED Final   Parainfluenza Virus 1 NOT DETECTED NOT DETECTED Final   Parainfluenza Virus 2 NOT DETECTED NOT DETECTED Final   Parainfluenza Virus 3 NOT DETECTED NOT DETECTED Final   Parainfluenza Virus 4 NOT DETECTED NOT DETECTED Final   Respiratory Syncytial Virus NOT DETECTED NOT DETECTED Final   Bordetella pertussis NOT DETECTED NOT DETECTED Final   Chlamydophila pneumoniae NOT DETECTED NOT DETECTED Final   Mycoplasma pneumoniae NOT DETECTED NOT DETECTED Final    Comment: Performed at [facility], [place] [address], [place], [place] [address]     Labs: Basic Metabolic Panel: Recent Labs  Lab 03/22/19 0539 03/23/19 0433 03/23/19 1102 03/24/19 0501 03/26/19 0436  NA 142 140 138 138 140  K 4.5 4.5 4.4 4.9 5.3*  CL 95* 95* 92* 93* 94*  CO2 38* 34* 37* 34* 36*  GLUCOSE 89 168* 164* 167* 100*  BUN 24* 19 15 17  37*  CREATININE 1.50* 1.17 1.11 1.01 1.38*  CALCIUM 8.9 8.5* 9.0 8.9 9.1  PHOS  --   --   --   --  3.9   Liver Function Tests: Recent Labs  Lab 03/22/19 0539 03/23/19 0433 03/23/19 1102 03/24/19 0501 03/26/19 0436  AST 1,211* 707* 639* 327* 98*  ALT 865* 825* 870* 621* 314*  ALKPHOS 77 76 90 86 78  BILITOT 0.9 0.8 0.4 0.3 0.9  PROT 6.8 7.1 7.6 7.0 7.0  ALBUMIN 2.7* 2.7* 3.0* 2.6* 2.5*   No results for input(s):  LIPASE, AMYLASE in the last 168 hours. Recent Labs  Lab 03/21/19 1346  AMMONIA 23   CBC: Recent Labs  Lab 03/21/19 1215  03/21/19 1310 03/21/19 1758 03/22/19 0539 03/23/19 0433 03/26/19 0436  WBC 12.5*  --   --   --  8.5 7.5 7.5  NEUTROABS 11.1*  --   --   --   --  5.6 5.0  HGB 12.7*   < > 12.6* 12.9* 10.9* 11.3* 11.8*  HCT 42.8   < > 37.0* 38.0* 36.0* 38.1* 40.2  MCV 91.5  --   --   --  89.3 90.3 91.0  PLT 219  --   --   --  234 237 270   < > = values in this interval not displayed.   Cardiac Enzymes: Recent Labs  Lab 03/21/19 1346  CKTOTAL 81   BNP: BNP (last 3 results) No results for input(s): BNP in the last 8760 hours.  ProBNP (last 3 results) No results for input(s): PROBNP in the last 8760 hours.  CBG: Recent Labs  Lab 03/25/19 2107 03/26/19 0010 03/26/19 0402 03/26/19 0724 03/26/19 1112  GLUCAP 130* 117* 90 85 180*       Signed:  Nita Sells MD   [facility] 03/26/2019, 1:02 PM

## 2019-03-26 NOTE — Discharge Instructions (Signed)
Wound care instructions (chest, implant site) Keep incision clean and dry for 3 days.  You can remove outer dressing tomorrow. Leave steri-strips (little pieces of tape) on until seen in the office for wound check appointment. Call the office [phone]) for redness, drainage, swelling, or fever.

## 2019-03-26 NOTE — Progress Notes (Signed)
Physical Therapy Treatment Patient Details Name: Phillip Owens MRN: [id number] DOB: 03/20/1941 Today's Date: 03/26/2019    History of Present Illness Patient is a 78 year old male admitted with AMS, aphasia. 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 No associated hemorrhage or mass effect.  Pt also with acute on chronic respiratory failure with hypoxia and hypercapnia, COPD without acute exacerbation:   Patient supposed to be on CPAP at night but was not wearing it.  He was just admitted at [facility] for respiratory failure from 11/29-12/1.     PT Comments    Patient received in bed, adamantly refusing OOB activity due to just having gotten back in bed and being tired from being up. Attempted to re-direct and convince him to participate, however he continues to refuse but is agreeable to bed exercises. Worked on ankle pumps, SLRs, supine hip abduction, heel slides, and glute sets. At end of session he states, "sorry I'd walk with you but I really am tired". He was left in bed with all needs met, bed alarm active this morning.     Follow Up Recommendations  SNF;Supervision for mobility/OOB     Equipment Recommendations  (defer)    Recommendations for Other Services       Precautions / Restrictions Precautions Precautions: Fall Precaution Comments: watch O2/HR Restrictions Weight Bearing Restrictions: No    Mobility  Bed Mobility               General bed mobility comments: refused due to having just gotten in bed  Transfers                 General transfer comment: refused due to just having gotten in bed  Ambulation/Gait             General Gait Details: refused due to just having gotten in bed   Stairs             Wheelchair Mobility    Modified Rankin (Stroke Patients Only) Modified Rankin (Stroke Patients  Only) Pre-Morbid Rankin Score: Slight disability Modified Rankin: Moderately severe disability     Balance Overall balance assessment: Needs assistance Sitting-balance support: Bilateral upper extremity supported;Feet supported Sitting balance-Leahy Scale: Good Sitting balance - Comments: close supervision   Standing balance support: Bilateral upper extremity supported Standing balance-Leahy Scale: Fair Standing balance comment: minA with BUe support of RW                            Cognition Arousal/Alertness: Awake/alert Behavior During Therapy: WFL for tasks assessed/performed Overall Cognitive Status: Impaired/Different from baseline Area of Impairment: Orientation;Memory;Following commands;Safety/judgement                 Orientation Level: Disoriented to;Situation;Time   Memory: Decreased short-term memory Following Commands: Follows one step commands with increased time;Follows one step commands inconsistently Safety/Judgement: Decreased awareness of safety;Decreased awareness of deficits            Exercises      General Comments        Pertinent Vitals/Pain Pain Assessment: No/denies pain    Home Living                      Prior Function            PT Goals (current goals can now be found   in the care plan section) Acute Rehab PT Goals Patient Stated Goal: to eat something PT Goal Formulation: With patient Time For Goal Achievement: 04/05/19 Potential to Achieve Goals: Fair Progress towards PT goals: Progressing toward goals    Frequency    Min 3X/week      PT Plan Current plan remains appropriate    Co-evaluation              AM-PAC PT "6 Clicks" Mobility   Outcome Measure  Help needed turning from your back to your side while in a flat bed without using bedrails?: A Little Help needed moving from lying on your back to sitting on the side of a flat bed without using bedrails?: A Little Help needed  moving to and from a bed to a chair (including a wheelchair)?: A Little Help needed standing up from a chair using your arms (e.g., wheelchair or bedside chair)?: A Little Help needed to walk in hospital room?: A Little Help needed climbing 3-5 steps with a railing? : Total 6 Click Score: 16    End of Session Equipment Utilized During Treatment: Oxygen Activity Tolerance: Patient tolerated treatment well Patient left: in bed;with call bell/phone within reach;with bed alarm set   PT Visit Diagnosis: Muscle weakness (generalized) (M62.81);Difficulty in walking, not elsewhere classified (R26.2);Dizziness and giddiness (R42)     Time: 1105-1122 PT Time Calculation (min) (ACUTE ONLY): 17 min  Charges:  $Therapeutic Exercise: 8-22 mins                     Kristen U PT, DPT, PN1   Supplemental Physical Therapist [address]    Pager [phone] Acute Rehab Office [phone]    

## 2019-03-26 NOTE — Consult Note (Addendum)
ELECTROPHYSIOLOGY CONSULT NOTE  Patient ID: Phillip Owens MRN, AGE 78: [id number], DOB/AGE: 10-03-1940, AGE 78   Admit date: 03/21/2019 Date of Consult: 03/26/2019  Primary Physician: Patient, No Pcp Per Primary Cardiologist: none Reason for Consultation: Cryptogenic stroke ; recommendations regarding Implantable Loop Recorder, requested by Dr. Leonie Man  History of Present Illness Phillip Owens was admitted on 03/21/2019 with confusion, encephalopathy, stroke.  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 Synchronized small vessel disease in the setting of hypoxia hypercarbia and recommends loop implant for Afib surveilence .  he has undergone workup for stroke including echocardiogram and carotid angio.  The patient had been monitored on telemetry which has demonstrated sinus rhythm with no arrhythmias by review of CV strips, he is no longer on telemetry.    Neurology is not requesting TEE He has had negative LE venous doppler for DVT   Echocardiogram this admission demonstrated   IMPRESSIONS 1. Left ventricular ejection fraction, by visual estimation, is 60 to 65%. The left ventricle has normal function. There is no left ventricular hypertrophy.  2. Global right ventricle has mildly reduced systolic function.The right ventricular size is mildly enlarged. No increase in right ventricular wall thickness.  3. Left atrial size was normal.  4. Right atrial size was normal.  5. The mitral valve is grossly normal. Mild mitral valve regurgitation.  6. The tricuspid valve is grossly normal. Tricuspid valve regurgitation is mild.  7. The aortic valve has an indeterminant number of cusps. Aortic valve regurgitation is trivial.  8. The pulmonic valve was grossly normal. Pulmonic valve regurgitation is not visualized.  9. Moderately elevated pulmonary artery systolic pressure. 10. The tricuspid regurgitant velocity is 3.62 m/s, and with an assumed right atrial pressure of 3 mmHg, the estimated right ventricular systolic pressure is moderately elevated at 55.4 mmHg. 11. The atrial septum is grossly normal.   Lab work is reviewed. COVID and full respiratory panel are negative BC were drawn at admission, neg x4 days, WBC is wnl, afebrile  Prior to admission, the patient mentions occasionally chest fullness that resolves with belching, otherwise denies chest pain, dizziness, palpitations, or syncope.  He has baseline significant SOB with his COPD.  He feels he is back to his baseline recovering from their stroke with plans to SNF at discharge (in d/w attending yesterday, planned for today or when bed is available).    Past Medical History:  Diagnosis Date   Cancer ([place])    Chronic respiratory failure with hypoxia, on home O2 therapy (HCC)    COPD (chronic obstructive pulmonary disease) (HCC)    Emphysema (subcutaneous) (surgical) resulting from a procedure    Essential hypertension    GERD (gastroesophageal reflux disease)    Hyperglycemia    Hyperlipidemia    Multiple lung nodules on CT    Obstructive sleep apnea on CPAP    Oxygen dependent    Renal insufficiency bilateral    Sensorineural hearing loss      Surgical History:  The histories are not reviewed yet. Please review them in the "History" navigator section and refresh this [place].   Medications Prior to Admission  Medication Sig Dispense Refill Last Dose  allopurinol (ZYLOPRIM) 300 MG tablet Take 300 mg by mouth daily.   03/20/2019   amLODipine (NORVASC) 10 MG tablet Take 10 mg by mouth daily.   03/20/2019   aspirin EC 81 MG tablet  Take 81 mg by mouth daily.   03/20/2019   brimonidine (ALPHAGAN) 0.2 % ophthalmic solution Place 1 drop into both eyes 2 (two) times daily.   03/20/2019   cholecalciferol (VITAMIN D3) 25 MCG (1000 UT) tablet Take 1,000 Units by mouth daily.   03/20/2019   dorzolamide (TRUSOPT) 2 % ophthalmic solution Place 1 drop into both eyes 2 (two) times daily.   03/20/2019   doxycycline (VIBRA-TABS) 100 MG tablet Take 100 mg by mouth 2 (two) times daily.   03/20/2019   ferrous sulfate 325 (65 FE) MG tablet Take 325 mg by mouth daily with breakfast.   03/19/2019   latanoprost (XALATAN) 0.005 % ophthalmic solution Place 1 drop into both eyes at bedtime.   03/19/2019   lovastatin (MEVACOR) 20 MG tablet Take 20 mg by mouth at bedtime.   03/19/2019   metoprolol tartrate (LOPRESSOR) 25 MG tablet Take 25 mg by mouth daily.   03/20/2019 at 0900   omeprazole (PRILOSEC) 40 MG capsule Take 40 mg by mouth daily.   03/20/2019   TRELEGY ELLIPTA 100-62.5-25 MCG/INH AEPB Take 1 puff by mouth daily.   03/20/2019   zolpidem (AMBIEN) 10 MG tablet Take 10 mg by mouth at bedtime as needed.   03/19/2019    Inpatient Medications:   allopurinol  300 mg Oral Daily   aspirin EC  81 mg Oral Daily   atorvastatin  40 mg Oral q1800   brimonidine  1 drop Both Eyes BID   clopidogrel  75 mg Oral Daily   dorzolamide  1 drop Both Eyes BID   ferrous sulfate  325 mg Oral Q breakfast   fluticasone furoate-vilanterol  1 puff Inhalation Daily   And   umeclidinium bromide  1 puff Inhalation Daily   heparin  5,000 Units Subcutaneous Q8H   ipratropium-albuterol  3 mL Nebulization BID   lactulose  20 g Oral Daily   metoprolol tartrate  25 mg Oral Daily   multivitamin with minerals  1 tablet Oral Daily   pantoprazole  40 mg Oral Daily   pneumococcal 23 valent vaccine  0.5 mL Intramuscular Tomorrow-1000   sodium chloride flush  3 mL Intravenous Q12H    Allergies: No Known Allergies  Social History   Socioeconomic  History   Marital status: Married    Spouse name: Not on file   Number of children: Not on file   Years of education: Not on file   Highest education level: Not on file  Occupational History   Not on file  Social Needs   Financial resource strain: Not on file   Food insecurity    Worry: Not on file    Inability: Not on file   Transportation needs    Medical: Not on file    Non-medical: Not on file  Tobacco Use   Smoking status: Former Smoker   Smokeless tobacco: Current User  Substance and Sexual Activity   Alcohol use: Not Currently   Drug use: Never   Sexual activity: Not on file  Lifestyle   Physical activity    Days per week: Not on file    Minutes per session: Not on file   Stress: Not on file  Relationships   Social connections    Talks on phone: Not  on file    Gets together: Not on file    Attends religious service: Not on file    Active member of club or organization: Not on file    Attends meetings of clubs or organizations: Not on file    Relationship status: Not on file   Intimate partner violence    Fear of current or ex partner: Not on file    Emotionally abused: Not on file    Physically abused: Not on file    Forced sexual activity: Not on file  Other Topics Concern   Not on file  Social History Narrative   Not on file     Family History  Problem Relation Age of Onset   Hypertension Mother    Hypertension Father       Review of Systems: "I think I have caught a cold", slight non-productive cough, no unusual SOB. All other systems reviewed and are otherwise negative except as noted above.  Physical Exam: Vitals:   03/25/19 2225 03/26/19 0017 03/26/19 0416 03/26/19 0722  BP:  132/78 111/70 (!) 162/85  Pulse: 94 96 83 87  Resp: (!) 22 20 18 18   Temp:  (!) 97.4 F (36.3 C) 98.6 F (37 C) 98.4 F (36.9 C)  TempSrc:  Axillary Oral Oral  SpO2: 94% 90% 97% 100%  Weight:   100.2 kg   Height:        GEN- The patient  is well appearing, alert and oriented x 3 today.   Head- normocephalic, atraumatic Eyes-  Sclera clear, conjunctiva pink Ears- hearing intact Oropharynx- clear Neck- supple Lungs- CTA b/l, normal work of breathing Heart- RRR, no murmurs, rubs or gallops  GI- soft, NT, ND Extremities- no clubbing, cyanosis, or edema MS- no significant deformity or atrophy Skin- no rash or lesion Psych- euthymic mood, full affect   Labs:   Lab Results  Component Value Date   WBC 7.5 03/26/2019   HGB 11.8 (L) 03/26/2019   HCT 40.2 03/26/2019   MCV 91.0 03/26/2019   PLT 270 03/26/2019    Recent Labs  Lab 03/26/19 0436  NA 140  K 5.3*  CL 94*  CO2 36*  BUN 37*  CREATININE 1.38*  CALCIUM 9.1  PROT 7.0  BILITOT 0.9  ALKPHOS 78  ALT 314*  AST 98*  GLUCOSE 100*   Lab Results  Component Value Date   CKTOTAL 81 03/21/2019   Lab Results  Component Value Date   CHOL 125 03/22/2019   Lab Results  Component Value Date   HDL 34 (L) 03/22/2019   Lab Results  Component Value Date   LDLCALC 73 03/22/2019   Lab Results  Component Value Date   TRIG 89 03/22/2019   Lab Results  Component Value Date   CHOLHDL 3.7 03/22/2019   No results found for: LDLDIRECT  No results found for: DDIMER   Radiology/Studies:   Ct Code Stroke Cta Head W/wo Contrast Result Date: 03/21/2019 CLINICAL DATA:  Altered mental status.  Aphasia. EXAM: CT ANGIOGRAPHY HEAD AND NECK CT PERFUSION BRAIN TECHNIQUE: Multidetector CT imaging of the head and neck was performed using the standard protocol during bolus administration of intravenous contrast. Multiplanar CT image reconstructions and MIPs were obtained to evaluate the vascular anatomy. Carotid stenosis measurements (when applicable) are obtained utilizing NASCET criteria, using the distal internal carotid diameter as the denominator. Multiphase CT imaging of the brain was performed following IV bolus contrast injection. Subsequent parametric perfusion maps  were calculated using RAPID  software. CONTRAST:  129mL OMNIPAQUE IOHEXOL 350 MG/ML SOLN COMPARISON:  Head CT earlier same day FINDINGS: CTA NECK FINDINGS Aortic arch: Aortic atherosclerosis. No aneurysm or dissection. Branching pattern is normal without origin stenosis. Right carotid system: Common carotid artery widely patent to the bifurcation. Calcified plaque at the carotid bifurcation and ICA bulb. No stenosis when compared to the more distal cervical ICA diameter. Some plaque affecting the distal cervical ICA but no stenosis. Left carotid system: Common carotid artery widely patent to the bifurcation. Calcified plaque at the carotid bifurcation and ICA bulb but no stenosis. Distal cervical ICA shows some plaque but no stenosis. Vertebral arteries: Both vertebral artery origins are widely patent. Both vertebral arteries appear approximately equal in size an widely patent through the cervical region to the foramen magnum. Skeleton: Minimal cervical spondylosis. Other neck: No mass or lymphadenopathy. Upper chest: Emphysema and pulmonary scarring.  No focal lesion. Review of the MIP images confirms the above findings CTA HEAD FINDINGS Anterior circulation: Both internal carotid arteries are patent through the skull base and siphon regions. Atherosclerotic calcification within the carotid siphon regions. Stenosis estimated at 50% on each side. The anterior and middle cerebral vessels are patent without proximal stenosis, aneurysm vascular malformation. No large or medium vessel occlusion is identified. Primary anterior circulation supply to both posterior cerebral arteries. Posterior circulation: Both vertebral arteries are patent through the foramen magnum to the basilar. Basilar artery is small but does not show focal stenosis. Both superior cerebellar arteries show flow. As noted above, both posterior cerebral arteries receive the majority of there supply from the anterior circulation. Venous sinuses: Patent  and normal. Anatomic variants: None significant otherwise. Review of the MIP images confirms the above findings CT Brain Perfusion Findings: ASPECTS: 10 CBF (<30%) Volume: 29mL Perfusion (Tmax>6.0s) volume: 23mL Mismatch Volume: 71mL Infarction Location:None present IMPRESSION: Normal CT perfusion study. Atherosclerotic change at both carotid bifurcations but no stenosis. Atherosclerotic change in the carotid siphon regions without stenosis greater than 50%. No intracranial large or medium vessel occlusion or correctable proximal stenosis. Aortic atherosclerosis. These results were communicated to Dr. Rory Percy at 12:59 pmon 12/3/2020by text page via the [facility] messaging system. Electronically Signed   By: Nelson Chimes M.D.   On: 03/21/2019 13:01    Dg Abd 1 View Result Date: 03/22/2019 CLINICAL DATA:  Transaminitis EXAM: ABDOMEN - 1 VIEW COMPARISON:  None. FINDINGS: The bowel gas pattern is normal. No radio-opaque calculi or other significant radiographic abnormality are seen. IMPRESSION: Negative. Electronically Signed   By: Prudencio Pair M.D.   On: 03/22/2019 00:40    Ct Head Wo Contrast Result Date: 03/21/2019 CLINICAL DATA:  Altered mental status and aphasia. EXAM: CT HEAD WITHOUT CONTRAST TECHNIQUE: Contiguous axial images were obtained from the base of the skull through the vertex without intravenous contrast. COMPARISON:  None. FINDINGS: Brain: No evidence of acute infarction, hemorrhage, hydrocephalus, extra-axial collection or mass lesion/mass effect. 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 There is mild diffuse low-attenuation within the subcortical and periventricular white matter compatible with chronic microvascular disease. Vascular: No hyperdense vessel or unexpected calcification. Skull: Normal. Negative for fracture or focal lesion. Sinuses/Orbits: Paranasal sinuses are clear. Mastoid air cells are clear. Post op changes involving both orbits noted. Other: None IMPRESSION: 1. No  acute intracranial abnormalities. 2. Chronic small vessel ischemic change . Electronically Signed   By: Kerby Moors M.D.   On: 03/21/2019 12:08    Mr Brain Wo Contrast Result Date: 03/22/2019 CLINICAL DATA:  78 year old  male with altered mental status. EXAM: MRI HEAD WITHOUT CONTRAST TECHNIQUE: Multiplanar, multiecho pulse sequences of the brain and surrounding structures were obtained without intravenous contrast. COMPARISON:  CT head, CTA head and neck and CT Perfusion 03/21/2019. [facility] brain MRI 07/29/2016. FINDINGS: Brain: Patchy and scattered restricted diffusion in the bilateral cerebellar hemispheres slightly greater on the right (series 5, image 57). Sparing of the brainstem. But there is punctate restricted diffusion at the bilateral lentiform/genu of the internal capsules (series 5, image 74). There is also a small area of restricted diffusion in the medial left periatrial white matter near the tail of the left hippocampus on series 5, image 71. No definite hippocampal diffusion abnormality. There is also a tiny cortical focus of restricted diffusion in the superior right parietal lobe (series 7, image 44). Associated T2 and FLAIR hyperintensity at the areas of acute involvement. No acute hemorrhage or mass effect. Stable chronic lacunar infarct in the right basal ganglia, right pons. Chronic microhemorrhages in the pons are stable since 2018. Patchy and confluent bilateral cerebral white matter T2 and FLAIR hyperintensity is stable to mildly progressed since 2018. No superimposed midline shift, mass effect, evidence of mass lesion, ventriculomegaly, extra-axial collection or acute intracranial hemorrhage. Cervicomedullary junction and pituitary are within normal limits. Vascular: Major intracranial vascular flow voids are stable since 2018. Skull and upper cervical spine: Negative visible cervical spine. Visualized bone marrow signal is within normal limits. Sinuses/Orbits: Stable and  negative. Other: Mastoids remain clear. Visible internal auditory structures appear normal. IMPRESSION: 1. Multiple small acute infarcts, most pronounced in the bilateral cerebellum but also punctate foci in the bilateral basal ganglia, right parietal lobe. No associated hemorrhage or mass effect. 2. The pattern might indicate a recent embolic event. But synchronous small vessel disease is also a possibility (see #3). 3. 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 Electronically Signed   By: Genevie Ann M.D.   On: 03/22/2019 01:13      Dg Chest Portable 1 View Result Date: 03/21/2019 CLINICAL DATA:  Shortness of breath. EXAM: PORTABLE CHEST 1 VIEW COMPARISON:  March 17, 2019. FINDINGS: Stable cardiomediastinal silhouette. No pneumothorax or pleural effusion is noted. Both lungs are clear. The visualized skeletal structures are unremarkable. IMPRESSION: No active disease. Electronically Signed   By: Marijo Conception M.D.   On: 03/21/2019 19:06           Vas [place] Lower Extremity Venous (dvt) Result Date: 03/25/2019  Lower Venous Study Indications: Stroke.  Comparison Study: no prior Performing Technologist: Abram Sander RVS  Examination Guidelines: A complete evaluation includes B-mode imaging, spectral Doppler, color Doppler, and power Doppler as needed of all accessible portions of each vessel. Bilateral testing is considered an integral part of a complete examination. Limited examinations for reoccurring indications may be performed as noted.         Summary: Right: There is no evidence of deep vein thrombosis in the lower extremity. No cystic structure found in the popliteal fossa. Left: There is no evidence of deep vein thrombosis in the lower extremity. No cystic structure found in the popliteal fossa.  *See table(s) above for measurements and observations. Electronically signed by Servando Snare MD  on 03/25/2019 at 5:04:25 PM.    Final     12-lead ECG SR All prior EKG's in EPIC reviewed with no documented atrial fibrillation  Telemetry no longer on telemetry, CV strops reviewed,  no AF  Assessment and Plan:  1. Cryptogenic stroke The patient presents with cryptogenic stroke. Neurology has not requested TEE.   The pt came with confusion/encephalopathy, this AM his AAO x4.  I spoke at length with the patient about monitoring for afib with either a 30 day event monitor or an implantable loop recorder. Implant procedure, risks, benefits, and alteratives to implantable loop recorder were discussed with the patient today.   I also discussed monitoring monthly fee billed through his insurance and any associated copay he may have. At this time, the patient is very clear in their decision to proceed with implantable loop recorder.   Wound care was reviewed with the patient (keep incision clean and dry for 3 days).  Wound check will be scheduled for the patient  Please call with questions.   Renee Dyane Dustman, PA-C 03/26/2019  I have seen, examined the patient, and reviewed the above assessment and plan.  Changes to above are made where necessary.  On exam, RRR.  I agree with Dr Leonie Man that long term monitoring with an implanted loop recorder is appropriate to evaluate for afib as the cause for his stroke.  Risks and benefits to ILR were discussed with the patient who wishes to proceed.  Co Sign: Thompson Grayer, MD 03/26/2019 1:56 PM

## 2019-03-26 NOTE — Progress Notes (Signed)
Patient refusing home CPAP for the night.  Patient vitals stable on 3L.  RT will continue to monitor.

## 2019-03-26 NOTE — H&P (View-Only) (Signed)
ELECTROPHYSIOLOGY CONSULT NOTE  Patient ID: Phillip Owens MRN: [id number], DOB/AGE: [birth year]   Admit date: 03/21/2019 Date of Consult: 03/26/2019  Primary Physician: Patient, No Pcp Per Primary Cardiologist: none Reason for Consultation: Cryptogenic stroke ; recommendations regarding Implantable Loop Recorder, requested by Dr. Leonie Man  History of Present Illness Phillip Owens was admitted on 03/21/2019 with confusion, encephalopathy, stroke.  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 Synchronized small vessel disease in the setting of hypoxia hypercarbia and recommends loop implant for Afib surveilence .  he has undergone workup for stroke including echocardiogram and carotid angio.  The patient had been monitored on telemetry which has demonstrated sinus rhythm with no arrhythmias by review of CV strips, he is no longer on telemetry.    Neurology is not requesting TEE He has had negative LE venous doppler for DVT   Echocardiogram this admission demonstrated   IMPRESSIONS 1. Left ventricular ejection fraction, by visual estimation, is 60 to 65%. The left ventricle has normal function. There is no left ventricular hypertrophy.  2. Global right ventricle has mildly reduced systolic function.The right ventricular size is mildly enlarged. No increase in right ventricular wall thickness.  3. Left atrial size was normal.  4. Right atrial size was normal.  5. The mitral valve is grossly normal. Mild mitral valve regurgitation.  6. The tricuspid valve is grossly normal. Tricuspid valve regurgitation is mild.  7. The aortic valve has an indeterminant number of cusps. Aortic valve regurgitation is trivial.  8. The pulmonic valve was grossly normal. Pulmonic valve regurgitation is not visualized.  9. Moderately elevated pulmonary artery systolic pressure. 10. The tricuspid regurgitant velocity is 3.62 m/s, and with an assumed right atrial pressure of 3 mmHg, the estimated right ventricular systolic pressure is moderately elevated at 55.4 mmHg. 11. The atrial septum is grossly normal.   Lab work is reviewed. COVID and full respiratory panel are negative BC were drawn at admission, neg x4 days, WBC is wnl, afebrile  Prior to admission, the patient mentions occasionally chest fullness that resolves with belching, otherwise denies chest pain, dizziness, palpitations, or syncope.  He has baseline significant SOB with his COPD.  He feels he is back to his baseline recovering from their stroke with plans to SNF at discharge (in d/w attending yesterday, planned for today or when bed is available).    Past Medical History:  Diagnosis Date   Cancer ([place])    Chronic respiratory failure with hypoxia, on home O2 therapy (HCC)    COPD (chronic obstructive pulmonary disease) (HCC)    Emphysema (subcutaneous) (surgical) resulting from a procedure    Essential hypertension    GERD (gastroesophageal reflux disease)    Hyperglycemia    Hyperlipidemia    Multiple lung nodules on CT    Obstructive sleep apnea on CPAP    Oxygen dependent    Renal insufficiency bilateral    Sensorineural hearing loss      Surgical History:  The histories are not reviewed yet. Please review them in the "History" navigator section and refresh this [place].   Medications Prior to Admission  Medication Sig Dispense Refill Last Dose  allopurinol (ZYLOPRIM) 300 MG tablet Take 300 mg by mouth daily.   03/20/2019   amLODipine (NORVASC) 10 MG tablet Take 10 mg by mouth daily.   03/20/2019   aspirin EC 81 MG tablet  Take 81 mg by mouth daily.   03/20/2019   brimonidine (ALPHAGAN) 0.2 % ophthalmic solution Place 1 drop into both eyes 2 (two) times daily.   03/20/2019   cholecalciferol (VITAMIN D3) 25 MCG (1000 UT) tablet Take 1,000 Units by mouth daily.   03/20/2019   dorzolamide (TRUSOPT) 2 % ophthalmic solution Place 1 drop into both eyes 2 (two) times daily.   03/20/2019   doxycycline (VIBRA-TABS) 100 MG tablet Take 100 mg by mouth 2 (two) times daily.   03/20/2019   ferrous sulfate 325 (65 FE) MG tablet Take 325 mg by mouth daily with breakfast.   03/19/2019   latanoprost (XALATAN) 0.005 % ophthalmic solution Place 1 drop into both eyes at bedtime.   03/19/2019   lovastatin (MEVACOR) 20 MG tablet Take 20 mg by mouth at bedtime.   03/19/2019   metoprolol tartrate (LOPRESSOR) 25 MG tablet Take 25 mg by mouth daily.   03/20/2019 at 0900   omeprazole (PRILOSEC) 40 MG capsule Take 40 mg by mouth daily.   03/20/2019   TRELEGY ELLIPTA 100-62.5-25 MCG/INH AEPB Take 1 puff by mouth daily.   03/20/2019   zolpidem (AMBIEN) 10 MG tablet Take 10 mg by mouth at bedtime as needed.   03/19/2019    Inpatient Medications:   allopurinol  300 mg Oral Daily   aspirin EC  81 mg Oral Daily   atorvastatin  40 mg Oral q1800   brimonidine  1 drop Both Eyes BID   clopidogrel  75 mg Oral Daily   dorzolamide  1 drop Both Eyes BID   ferrous sulfate  325 mg Oral Q breakfast   fluticasone furoate-vilanterol  1 puff Inhalation Daily   And   umeclidinium bromide  1 puff Inhalation Daily   heparin  5,000 Units Subcutaneous Q8H   ipratropium-albuterol  3 mL Nebulization BID   lactulose  20 g Oral Daily   metoprolol tartrate  25 mg Oral Daily   multivitamin with minerals  1 tablet Oral Daily   pantoprazole  40 mg Oral Daily   pneumococcal 23 valent vaccine  0.5 mL Intramuscular Tomorrow-1000   sodium chloride flush  3 mL Intravenous Q12H    Allergies: No Known Allergies  Social History   Socioeconomic  History   Marital status: Married    Spouse name: Not on file   Number of children: Not on file   Years of education: Not on file   Highest education level: Not on file  Occupational History   Not on file  Social Needs   Financial resource strain: Not on file   Food insecurity    Worry: Not on file    Inability: Not on file   Transportation needs    Medical: Not on file    Non-medical: Not on file  Tobacco Use   Smoking status: Former Smoker   Smokeless tobacco: Current User  Substance and Sexual Activity   Alcohol use: Not Currently   Drug use: Never   Sexual activity: Not on file  Lifestyle   Physical activity    Days per week: Not on file    Minutes per session: Not on file   Stress: Not on file  Relationships   Social connections    Talks on phone: Not  on file    Gets together: Not on file    Attends religious service: Not on file    Active member of club or organization: Not on file    Attends meetings of clubs or organizations: Not on file    Relationship status: Not on file   Intimate partner violence    Fear of current or ex partner: Not on file    Emotionally abused: Not on file    Physically abused: Not on file    Forced sexual activity: Not on file  Other Topics Concern   Not on file  Social History Narrative   Not on file     Family History  Problem Relation Age of Onset   Hypertension Mother    Hypertension Father       Review of Systems: "I think I have caught a cold", slight non-productive cough, no unusual SOB. All other systems reviewed and are otherwise negative except as noted above.  Physical Exam: Vitals:   03/25/19 2225 03/26/19 0017 03/26/19 0416 03/26/19 0722  BP:  132/78 111/70 (!) 162/85  Pulse: 94 96 83 87  Resp: (!) 22 20 18 18   Temp:  (!) 97.4 F (36.3 C) 98.6 F (37 C) 98.4 F (36.9 C)  TempSrc:  Axillary Oral Oral  SpO2: 94% 90% 97% 100%  Weight:   100.2 kg   Height:        GEN- The patient  is well appearing, alert and oriented x 3 today.   Head- normocephalic, atraumatic Eyes-  Sclera clear, conjunctiva pink Ears- hearing intact Oropharynx- clear Neck- supple Lungs- CTA b/l, normal work of breathing Heart- RRR, no murmurs, rubs or gallops  GI- soft, NT, ND Extremities- no clubbing, cyanosis, or edema MS- no significant deformity or atrophy Skin- no rash or lesion Psych- euthymic mood, full affect   Labs:   Lab Results  Component Value Date   WBC 7.5 03/26/2019   HGB 11.8 (L) 03/26/2019   HCT 40.2 03/26/2019   MCV 91.0 03/26/2019   PLT 270 03/26/2019    Recent Labs  Lab 03/26/19 0436  NA 140  K 5.3*  CL 94*  CO2 36*  BUN 37*  CREATININE 1.38*  CALCIUM 9.1  PROT 7.0  BILITOT 0.9  ALKPHOS 78  ALT 314*  AST 98*  GLUCOSE 100*   Lab Results  Component Value Date   CKTOTAL 81 03/21/2019   Lab Results  Component Value Date   CHOL 125 03/22/2019   Lab Results  Component Value Date   HDL 34 (L) 03/22/2019   Lab Results  Component Value Date   LDLCALC 73 03/22/2019   Lab Results  Component Value Date   TRIG 89 03/22/2019   Lab Results  Component Value Date   CHOLHDL 3.7 03/22/2019   No results found for: LDLDIRECT  No results found for: DDIMER   Radiology/Studies:   Ct Code Stroke Cta Head W/wo Contrast Result Date: 03/21/2019 CLINICAL DATA:  Altered mental status.  Aphasia. EXAM: CT ANGIOGRAPHY HEAD AND NECK CT PERFUSION BRAIN TECHNIQUE: Multidetector CT imaging of the head and neck was performed using the standard protocol during bolus administration of intravenous contrast. Multiplanar CT image reconstructions and MIPs were obtained to evaluate the vascular anatomy. Carotid stenosis measurements (when applicable) are obtained utilizing NASCET criteria, using the distal internal carotid diameter as the denominator. Multiphase CT imaging of the brain was performed following IV bolus contrast injection. Subsequent parametric perfusion maps  were calculated using RAPID  software. CONTRAST:  136mL OMNIPAQUE IOHEXOL 350 MG/ML SOLN COMPARISON:  Head CT earlier same day FINDINGS: CTA NECK FINDINGS Aortic arch: Aortic atherosclerosis. No aneurysm or dissection. Branching pattern is normal without origin stenosis. Right carotid system: Common carotid artery widely patent to the bifurcation. Calcified plaque at the carotid bifurcation and ICA bulb. No stenosis when compared to the more distal cervical ICA diameter. Some plaque affecting the distal cervical ICA but no stenosis. Left carotid system: Common carotid artery widely patent to the bifurcation. Calcified plaque at the carotid bifurcation and ICA bulb but no stenosis. Distal cervical ICA shows some plaque but no stenosis. Vertebral arteries: Both vertebral artery origins are widely patent. Both vertebral arteries appear approximately equal in size an widely patent through the cervical region to the foramen magnum. Skeleton: Minimal cervical spondylosis. Other neck: No mass or lymphadenopathy. Upper chest: Emphysema and pulmonary scarring.  No focal lesion. Review of the MIP images confirms the above findings CTA HEAD FINDINGS Anterior circulation: Both internal carotid arteries are patent through the skull base and siphon regions. Atherosclerotic calcification within the carotid siphon regions. Stenosis estimated at 50% on each side. The anterior and middle cerebral vessels are patent without proximal stenosis, aneurysm vascular malformation. No large or medium vessel occlusion is identified. Primary anterior circulation supply to both posterior cerebral arteries. Posterior circulation: Both vertebral arteries are patent through the foramen magnum to the basilar. Basilar artery is small but does not show focal stenosis. Both superior cerebellar arteries show flow. As noted above, both posterior cerebral arteries receive the majority of there supply from the anterior circulation. Venous sinuses: Patent  and normal. Anatomic variants: None significant otherwise. Review of the MIP images confirms the above findings CT Brain Perfusion Findings: ASPECTS: 10 CBF (<30%) Volume: 19mL Perfusion (Tmax>6.0s) volume: 68mL Mismatch Volume: 95mL Infarction Location:None present IMPRESSION: Normal CT perfusion study. Atherosclerotic change at both carotid bifurcations but no stenosis. Atherosclerotic change in the carotid siphon regions without stenosis greater than 50%. No intracranial large or medium vessel occlusion or correctable proximal stenosis. Aortic atherosclerosis. These results were communicated to Dr. Rory Percy at 12:59 pmon 12/3/2020by text page via the [facility] messaging system. Electronically Signed   By: Nelson Chimes M.D.   On: 03/21/2019 13:01    Dg Abd 1 View Result Date: 03/22/2019 CLINICAL DATA:  Transaminitis EXAM: ABDOMEN - 1 VIEW COMPARISON:  None. FINDINGS: The bowel gas pattern is normal. No radio-opaque calculi or other significant radiographic abnormality are seen. IMPRESSION: Negative. Electronically Signed   By: Prudencio Pair M.D.   On: 03/22/2019 00:40    Ct Head Wo Contrast Result Date: 03/21/2019 CLINICAL DATA:  Altered mental status and aphasia. EXAM: CT HEAD WITHOUT CONTRAST TECHNIQUE: Contiguous axial images were obtained from the base of the skull through the vertex without intravenous contrast. COMPARISON:  None. FINDINGS: Brain: No evidence of acute infarction, hemorrhage, hydrocephalus, extra-axial collection or mass lesion/mass effect. 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 There is mild diffuse low-attenuation within the subcortical and periventricular white matter compatible with chronic microvascular disease. Vascular: No hyperdense vessel or unexpected calcification. Skull: Normal. Negative for fracture or focal lesion. Sinuses/Orbits: Paranasal sinuses are clear. Mastoid air cells are clear. Post op changes involving both orbits noted. Other: None IMPRESSION: 1. No  acute intracranial abnormalities. 2. Chronic small vessel ischemic change . Electronically Signed   By: Kerby Moors M.D.   On: 03/21/2019 12:08    Mr Brain Wo Contrast Result Date: 03/22/2019 CLINICAL DATA:  78 year old  male with altered mental status. EXAM: MRI HEAD WITHOUT CONTRAST TECHNIQUE: Multiplanar, multiecho pulse sequences of the brain and surrounding structures were obtained without intravenous contrast. COMPARISON:  CT head, CTA head and neck and CT Perfusion 03/21/2019. [facility] brain MRI 07/29/2016. FINDINGS: Brain: Patchy and scattered restricted diffusion in the bilateral cerebellar hemispheres slightly greater on the right (series 5, image 57). Sparing of the brainstem. But there is punctate restricted diffusion at the bilateral lentiform/genu of the internal capsules (series 5, image 74). There is also a small area of restricted diffusion in the medial left periatrial white matter near the tail of the left hippocampus on series 5, image 71. No definite hippocampal diffusion abnormality. There is also a tiny cortical focus of restricted diffusion in the superior right parietal lobe (series 7, image 44). Associated T2 and FLAIR hyperintensity at the areas of acute involvement. No acute hemorrhage or mass effect. Stable chronic lacunar infarct in the right basal ganglia, right pons. Chronic microhemorrhages in the pons are stable since 2018. Patchy and confluent bilateral cerebral white matter T2 and FLAIR hyperintensity is stable to mildly progressed since 2018. No superimposed midline shift, mass effect, evidence of mass lesion, ventriculomegaly, extra-axial collection or acute intracranial hemorrhage. Cervicomedullary junction and pituitary are within normal limits. Vascular: Major intracranial vascular flow voids are stable since 2018. Skull and upper cervical spine: Negative visible cervical spine. Visualized bone marrow signal is within normal limits. Sinuses/Orbits: Stable and  negative. Other: Mastoids remain clear. Visible internal auditory structures appear normal. IMPRESSION: 1. Multiple small acute infarcts, most pronounced in the bilateral cerebellum but also punctate foci in the bilateral basal ganglia, right parietal lobe. No associated hemorrhage or mass effect. 2. The pattern might indicate a recent embolic event. But synchronous small vessel disease is also a possibility (see #3). 3. 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 Electronically Signed   By: Genevie Ann M.D.   On: 03/22/2019 01:13      Dg Chest Portable 1 View Result Date: 03/21/2019 CLINICAL DATA:  Shortness of breath. EXAM: PORTABLE CHEST 1 VIEW COMPARISON:  March 17, 2019. FINDINGS: Stable cardiomediastinal silhouette. No pneumothorax or pleural effusion is noted. Both lungs are clear. The visualized skeletal structures are unremarkable. IMPRESSION: No active disease. Electronically Signed   By: Marijo Conception M.D.   On: 03/21/2019 19:06           Vas [place] Lower Extremity Venous (dvt) Result Date: 03/25/2019  Lower Venous Study Indications: Stroke.  Comparison Study: no prior Performing Technologist: Abram Sander RVS  Examination Guidelines: A complete evaluation includes B-mode imaging, spectral Doppler, color Doppler, and power Doppler as needed of all accessible portions of each vessel. Bilateral testing is considered an integral part of a complete examination. Limited examinations for reoccurring indications may be performed as noted.         Summary: Right: There is no evidence of deep vein thrombosis in the lower extremity. No cystic structure found in the popliteal fossa. Left: There is no evidence of deep vein thrombosis in the lower extremity. No cystic structure found in the popliteal fossa.  *See table(s) above for measurements and observations. Electronically signed by Servando Snare MD  on 03/25/2019 at 5:04:25 PM.    Final     12-lead ECG SR All prior EKG's in EPIC reviewed with no documented atrial fibrillation  Telemetry no longer on telemetry, CV strops reviewed,  no AF  Assessment and Plan:  1. Cryptogenic stroke The patient presents with cryptogenic stroke. Neurology has not requested TEE.   The pt came with confusion/encephalopathy, this AM his AAO x4.  I spoke at length with the patient about monitoring for afib with either a 30 day event monitor or an implantable loop recorder. Implant procedure, risks, benefits, and alteratives to implantable loop recorder were discussed with the patient today.   I also discussed monitoring monthly fee billed through his insurance and any associated copay he may have. At this time, the patient is very clear in their decision to proceed with implantable loop recorder.   Wound care was reviewed with the patient (keep incision clean and dry for 3 days).  Wound check will be scheduled for the patient  Please call with questions.   Renee Dyane Dustman, PA-C 03/26/2019  I have seen, examined the patient, and reviewed the above assessment and plan.  Changes to above are made where necessary.  On exam, RRR.  I agree with Dr Leonie Man that long term monitoring with an implanted loop recorder is appropriate to evaluate for afib as the cause for his stroke.  Risks and benefits to ILR were discussed with the patient who wishes to proceed.  Co Sign: Thompson Grayer, MD 03/26/2019 1:56 PM

## 2019-03-26 NOTE — Interval H&P Note (Signed)
History and Physical Interval Note:  03/26/2019 1:57 PM  Phillip Owens  has presented today for surgery, with the diagnosis of stroke.  The various methods of treatment have been discussed with the patient and family. After consideration of risks, benefits and other options for treatment, the patient has consented to  Procedure(s): LOOP RECORDER INSERTION (N/A) as a surgical intervention.  The patient's history has been reviewed, patient examined, no change in status, stable for surgery.  I have reviewed the patient's chart and labs.  Questions were answered to the patient's satisfaction.     Thompson Grayer

## 2019-03-26 NOTE — TOC Progression Note (Addendum)
Transition of Care [facility]) - Progression Note    Patient Details  Name: Phillip Owens MRN: [id number] Date of Birth: [birth year]  [facility] CM/SW Murray Amay Mijangos, [place] Phone Number: [phone] 03/26/2019, 10:09 AM  Clinical Narrative:     CSW spoke with patient's spouse Bonnita Nasuti and daughter Otila Kluver to provide bed offers, both agree to choose [facility]. CSW has informed [facility], they are initiating [facility] today.   Pending [employer] auth at this time.  Pending updated covid test at this time.   Expected Discharge Plan: [place] Barriers to Discharge: Continued Medical Work up  Expected Discharge Plan and Services Expected Discharge Plan: [place] Choice: [place] arrangements for the past 2 months: Single Family Home                                       Social Determinants of Health (SDOH) Interventions    Readmission Risk Interventions No flowsheet data found.

## 2019-03-27 ENCOUNTER — Encounter (HOSPITAL_COMMUNITY): Payer: Self-pay | Admitting: Internal Medicine

## 2019-03-27 LAB — COMPREHENSIVE METABOLIC PANEL
ALT: 223 U/L — ABNORMAL HIGH (ref 0–44)
AST: 56 U/L — ABNORMAL HIGH (ref 15–41)
Albumin: 2.7 g/dL — ABNORMAL LOW (ref 3.5–5.0)
Alkaline Phosphatase: 77 U/L (ref 38–126)
Anion gap: 11 (ref 5–15)
BUN: 33 mg/dL — ABNORMAL HIGH (ref 8–23)
CO2: 34 mmol/L — ABNORMAL HIGH (ref 22–32)
Calcium: 9.4 mg/dL (ref 8.9–10.3)
Chloride: 92 mmol/L — ABNORMAL LOW (ref 98–111)
Creatinine, Ser: 1.4 mg/dL — ABNORMAL HIGH (ref 0.61–1.24)
GFR calc Af Amer: 56 mL/min — ABNORMAL LOW (ref >60)
GFR calc non Af Amer: 48 mL/min — ABNORMAL LOW (ref >60)
Glucose, Bld: 142 mg/dL — ABNORMAL HIGH (ref 70–99)
Potassium: 5.5 mmol/L — ABNORMAL HIGH (ref 3.5–5.1)
Sodium: 137 mmol/L (ref 135–145)
Total Bilirubin: 0.7 mg/dL (ref 0.3–1.2)
Total Protein: 7.4 g/dL (ref 6.5–8.1)

## 2019-03-27 MED ORDER — SODIUM ZIRCONIUM CYCLOSILICATE 10 G PO PACK
10.0000 g | PACK | Freq: Once | ORAL | Status: AC
  Administered 2019-03-27: 10 g via ORAL
  Filled 2019-03-27: qty 1

## 2019-03-27 MED ORDER — SODIUM CHLORIDE 0.9 % IV SOLN
INTRAVENOUS | Status: DC
  Administered 2019-03-27 – 2019-03-28 (×3): via INTRAVENOUS

## 2019-03-27 NOTE — Progress Notes (Signed)
PROGRESS NOTE    Phillip Owens  [id number] DOB: June 22, 1940 DOA: 03/21/2019 PCP: Patient, No Pcp Per   Brief Narrative:  78 year old male with multiple complex medical history with COPD/chronic hypoxic respiratory failure on 4 L oxygen, smoker, OSA, lung nodule 7.9 mm diverticulosis, internal hemorrhoids, hypertension, diabetes mellitus admitted with altered mental status aphasia.  Found unresponsive at home on 12/4 "history of was called with right eye deviation.  Covid was negative.  PCO2 of 70 on ABG evaluation positive for opiates. He was admitted- MRI showed multiple small acute infarcts, seen by neurology.completed stroke work-up  Subjective: Seen this morning is alert awake, follows commands. Overnight refused CPAP.  Assessment & Plan: Toxic metabolic encephalopathy combination of CO2 narcosis and stroke: Now resolved.  Alert,awake, appears chronically sick.  Bilateral multiple acute infarcts; continue antiplatelet Plavix 75 mg aspirin  81  Mg x 3k-after 3 weeks discontinue Plavix, cont asa only.  Lipitor 40, and loop recorder per card.  Seen by neurology.  EEG no seizures, LDL 73, hemoglobin A1c 7.4  Mandibular cancer status post surgery many years ago. Dysphagia from new stroke, seen by speech continue on dysphagia 1 with honey thick diet, core track removed.  Follow-up with speech outpatient  AKI/hyperkalemia: Creatinine is uptrending now again. ivf were stopped 12/6. Will give 1 dose of lokelma add gentle IV fluids and monitor overnight Recent Labs  Lab 03/23/19 0433 03/23/19 1102 03/24/19 0501 03/26/19 0436 03/27/19 0959  BUN 19 15 17  37* 33*  CREATININE 1.17 1.11 1.01 1.38* 1.40*   Transaminitis-LFTs improving.  Hepatitis serology was negative.  DM type 2-blood sugar fairly stable continue sliding scale insulin.  Hemoglobin A1c uncontrolled at 7.4   Hypertension blood pressure is controlled.  Continue metoprolol.  Hyperlipidemia goal less than 70 LDL 73. Now on  statins. 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  Narrow-angle glaucoma  Gout  Will hold of discharge today given worsening hyperkalemia and renal function.  reasses tomorrow.  Nutrition: Nutrition Problem: Inadequate oral intake Etiology: inability to eat Signs/Symptoms: NPO status Interventions: Refer to RD note for recommendations  Pressure Ulcer:  Body mass index is 30.17 kg/m.  Family Communication: plan of care discussed with patient at bedside.  Family updated previously regarding discharge. Disposition Plan: SNF tomorrow if renal functions and potassium stable.  Consultants: Neurology/cardiology Procedures:  Loop recorder implant 12/8 TTE  1. Left ventricular ejection fraction, by visual estimation, is 60 to 65%. The left ventricle has normal function. There is no left ventricular hypertrophy.  2. Global right ventricle has mildly reduced systolic function.The right ventricular size is mildly enlarged. No increase in right ventricular wall thickness.  3. Left atrial size was normal.  4. Right atrial size was normal.  5. The mitral valve is grossly normal. Mild mitral valve regurgitation.  6. The tricuspid valve is grossly normal. Tricuspid valve regurgitation is mild.  7. The aortic valve has an indeterminant number of cusps. Aortic valve regurgitation is trivial.  8. The pulmonic valve was grossly normal. Pulmonic valve regurgitation is not visualized.  9. Moderately elevated pulmonary artery systolic pressure. 10. The tricuspid regurgitant velocity is 3.62 m/s, and with an assumed right atrial pressure of 3 mmHg, the estimated right ventricular systolic pressure is moderately elevated at 55.4 mmHg. 11. The atrial septum is grossly normal. Antimicrobials: Anti-infectives (From  admission, onward)  None       Objective: Vitals:   03/27/19 0046 03/27/19 0426 03/27/19 0426 03/27/19 0725  BP: 128/80  122/69 110/69  Pulse: 88  81 80  Resp: 18  20 18   Temp: 98.6 F (37 C)  98.2 F (36.8 C) 98 F (36.7 C)  TempSrc: Oral  Oral Oral  SpO2: 100%  100% 100%  Weight:  100.9 kg    Height:        Intake/Output Summary (Last 24 hours) at 03/27/2019 0934 Last data filed at 03/27/2019 0838 Gross per 24 hour  Intake 480 ml  Output 750 ml  Net -270 ml   Filed Weights   03/25/19 0418 03/26/19 0416 03/27/19 0426  Weight: 100.1 kg 100.2 kg 100.9 kg   Weight change: 0.7 kg  Body mass index is 30.17 kg/m.  Intake/Output from previous day: 12/08 0701 - 12/09 0700 In: 480 [P.O.:480] Out: 750 [Urine:750] Intake/Output this shift: Total I/O In: 120 [P.O.:120] Out: -   Examination:  General exam:alert awake and oriented at baseline, on [place]. HEENT:Oral mucosa moist, Ear/Nose WNL grossly, dentition normal. Respiratory system: Diminished at the base,no wheezing or crackles,no use of accessory muscle Cardiovascular system: S1 & S2 +, No JVD,. Gastrointestinal system: Abdomen soft, NT,ND, BS+ Nervous System:Alert, awake, Moving his extremities symmetrical, weakness on the left shoulder chronic Extremities: No edema, distal peripheral pulses palpable. Skin: No rashes,no icterus. MSK: Normal muscle bulk,tone, power  Medications:  Scheduled Meds:  allopurinol  300 mg Oral Daily   aspirin EC  81 mg Oral Daily   atorvastatin  40 mg Oral q1800   brimonidine  1 drop Both Eyes BID   clopidogrel  75 mg Oral Daily   dorzolamide  1 drop Both Eyes BID   ferrous sulfate  325 mg Oral Q breakfast   fluticasone furoate-vilanterol  1 puff Inhalation Daily   And   umeclidinium bromide  1 puff Inhalation Daily   heparin  5,000 Units Subcutaneous Q8H   ipratropium-albuterol  3 mL Nebulization BID   lactulose  20 g Oral Daily   metoprolol tartrate  25 mg Oral  Daily   multivitamin with minerals  1 tablet Oral Daily   pantoprazole  40 mg Oral Daily   pneumococcal 23 valent vaccine  0.5 mL Intramuscular Tomorrow-1000   sodium chloride flush  3 mL Intravenous Q12H   Continuous Infusions:  Data Reviewed: I have personally reviewed following labs and imaging studies  CBC: Recent Labs  Lab 03/21/19 1215  03/21/19 1310 03/21/19 1758 03/22/19 0539 03/23/19 0433 03/26/19 0436  WBC 12.5*  --   --   --  8.5 7.5 7.5  NEUTROABS 11.1*  --   --   --   --  5.6 5.0  HGB 12.7*   < > 12.6* 12.9* 10.9* 11.3* 11.8*  HCT 42.8   < > 37.0* 38.0* 36.0* 38.1* 40.2  MCV 91.5  --   --   --  89.3 90.3 91.0  PLT 219  --   --   --  234 237 270   < > = values in this interval not displayed.   Basic Metabolic Panel: Recent Labs  Lab 03/22/19 0539 03/23/19 0433 03/23/19 1102 03/24/19 0501 03/26/19 0436  NA 142 140 138 138 140  K 4.5 4.5 4.4 4.9 5.3*  CL 95* 95* 92* 93* 94*  CO2 38* 34* 37* 34* 36*  GLUCOSE 89 168* 164* 167* 100*  BUN 24* 19 15 17  37*  CREATININE  1.50* 1.17 1.11 1.01 1.38*  CALCIUM 8.9 8.5* 9.0 8.9 9.1  PHOS  --   --   --   --  3.9   GFR: Estimated Creatinine Clearance: 55.1 mL/min (A) (by C-G formula based on SCr of 1.38 mg/dL (H)). Liver Function Tests: Recent Labs  Lab 03/22/19 0539 03/23/19 0433 03/23/19 1102 03/24/19 0501 03/26/19 0436  AST 1,211* 707* 639* 327* 98*  ALT 865* 825* 870* 621* 314*  ALKPHOS 77 76 90 86 78  BILITOT 0.9 0.8 0.4 0.3 0.9  PROT 6.8 7.1 7.6 7.0 7.0  ALBUMIN 2.7* 2.7* 3.0* 2.6* 2.5*   No results for input(s): LIPASE, AMYLASE in the last 168 hours. Recent Labs  Lab 03/21/19 1346  AMMONIA 23   Coagulation Profile: Recent Labs  Lab 03/21/19 1215 03/23/19 0433  INR 1.3* 1.2   Cardiac Enzymes: Recent Labs  Lab 03/21/19 1346  CKTOTAL 81   BNP (last 3 results) No results for input(s): PROBNP in the last 8760 hours. HbA1C: No results for input(s): HGBA1C in the last 72  hours. CBG: Recent Labs  Lab 03/26/19 0010 03/26/19 0402 03/26/19 0724 03/26/19 1112 03/26/19 1611  GLUCAP 117* 90 85 180* 134*   Lipid Profile: No results for input(s): CHOL, HDL, LDLCALC, TRIG, CHOLHDL, LDLDIRECT in the last 72 hours. Thyroid Function Tests: No results for input(s): TSH, T4TOTAL, FREET4, T3FREE, THYROIDAB in the last 72 hours. Anemia Panel: No results for input(s): VITAMINB12, FOLATE, FERRITIN, TIBC, IRON, RETICCTPCT in the last 72 hours. Sepsis Labs: Recent Labs  Lab 03/21/19 1516 03/21/19 1822  LATICACIDVEN 2.5* 1.5    Recent Results (from the past 240 hour(s))  Culture, blood (routine x 2)     Status: None   Collection Time: 03/21/19  3:16 PM   Specimen: BLOOD RIGHT HAND  Result Value Ref Range Status   Specimen Description BLOOD RIGHT HAND  Final   Special Requests   Final    BOTTLES DRAWN AEROBIC AND ANAEROBIC Blood Culture results may not be optimal due to an inadequate volume of blood received in culture bottles   Culture   Final    NO GROWTH 5 DAYS Performed at [facility], [place] [address], [place], [place] [address]    Report Status 03/26/2019 FINAL  Final  SARS Coronavirus 2 by RT PCR (hospital order, performed in [facility] hospital lab) Nasopharyngeal Nasopharyngeal Swab     Status: None   Collection Time: 03/21/19  3:24 PM   Specimen: Nasopharyngeal Swab  Result Value Ref Range Status   SARS Coronavirus 2 NEGATIVE NEGATIVE Final    Comment: (NOTE) SARS-CoV-2 target nucleic acids are NOT DETECTED. The SARS-CoV-2 RNA is generally detectable in upper and lower respiratory specimens during the acute phase of infection. The lowest concentration of SARS-CoV-2 viral copies this assay can detect is 250 copies / mL. A negative result does not preclude SARS-CoV-2 infection and should not be used as the sole basis for treatment or other patient management decisions.  A negative result may occur with improper specimen collection /  handling, submission of specimen other than nasopharyngeal swab, presence of viral mutation(s) within the areas targeted by this assay, and inadequate number of viral copies (<250 copies / mL). A negative result must be combined with clinical observations, patient history, and epidemiological information. Fact Sheet for Patients:   StrictlyIdeas.no Fact Sheet for Healthcare Providers: BankingDealers.co.za This test is not yet approved or cleared  by the [place] FDA and has been authorized for  detection and/or diagnosis of SARS-CoV-2 by FDA under an Emergency Use Authorization (EUA).  This EUA will remain in effect (meaning this test can be used) for the duration of the COVID-19 declaration under Section 564(b)(1) of the Act, 21 U.S.C. section 360bbb-3(b)(1), unless the authorization is terminated or revoked sooner. Performed at [facility], [place] [address], [place], [place] [address]   Culture, blood (routine x 2)     Status: None   Collection Time: 03/21/19  4:05 PM   Specimen: BLOOD LEFT WRIST  Result Value Ref Range Status   Specimen Description BLOOD LEFT WRIST  Final   Special Requests   Final    BOTTLES DRAWN AEROBIC AND ANAEROBIC Blood Culture results may not be optimal due to an inadequate volume of blood received in culture bottles   Culture   Final    NO GROWTH 5 DAYS Performed at [facility], [place] [address], [place], [place] [address]    Report Status 03/26/2019 FINAL  Final  Respiratory Panel by PCR     Status: None   Collection Time: 03/21/19 10:01 PM   Specimen: Nasopharyngeal Swab; Respiratory  Result Value Ref Range Status   Adenovirus NOT DETECTED NOT DETECTED Final   Coronavirus 229E NOT DETECTED NOT DETECTED Final    Comment: (NOTE) The Coronavirus on the Respiratory Panel, DOES NOT test for the novel  Coronavirus (2019 nCoV)    Coronavirus HKU1 NOT DETECTED NOT DETECTED Final   Coronavirus  NL63 NOT DETECTED NOT DETECTED Final   Coronavirus OC43 NOT DETECTED NOT DETECTED Final   Metapneumovirus NOT DETECTED NOT DETECTED Final   Rhinovirus / Enterovirus NOT DETECTED NOT DETECTED Final   Influenza A NOT DETECTED NOT DETECTED Final   Influenza B NOT DETECTED NOT DETECTED Final   Parainfluenza Virus 1 NOT DETECTED NOT DETECTED Final   Parainfluenza Virus 2 NOT DETECTED NOT DETECTED Final   Parainfluenza Virus 3 NOT DETECTED NOT DETECTED Final   Parainfluenza Virus 4 NOT DETECTED NOT DETECTED Final   Respiratory Syncytial Virus NOT DETECTED NOT DETECTED Final   Bordetella pertussis NOT DETECTED NOT DETECTED Final   Chlamydophila pneumoniae NOT DETECTED NOT DETECTED Final   Mycoplasma pneumoniae NOT DETECTED NOT DETECTED Final    Comment: Performed at [facility], [place] [address], [place], [place] [address]  SARS CORONAVIRUS 2 (TAT 6-24 HRS) Nasopharyngeal Nasopharyngeal Swab     Status: None   Collection Time: 03/26/19  1:04 PM   Specimen: Nasopharyngeal Swab  Result Value Ref Range Status   SARS Coronavirus 2 NEGATIVE NEGATIVE Final    Comment: (NOTE) SARS-CoV-2 target nucleic acids are NOT DETECTED. The SARS-CoV-2 RNA is generally detectable in upper and lower respiratory specimens during the acute phase of infection. Negative results do not preclude SARS-CoV-2 infection, do not rule out co-infections with other pathogens, and should not be used as the sole basis for treatment or other patient management decisions. Negative results must be combined with clinical observations, patient history, and epidemiological information. The expected result is Negative. Fact Sheet for Patients: SugarRoll.be Fact Sheet for Healthcare Providers: https://www.woods-mathews.com/ This test is not yet approved or cleared by the [place] FDA and  has been authorized for detection and/or diagnosis of SARS-CoV-2 by FDA under an Emergency Use  Authorization (EUA). This EUA will remain  in effect (meaning this test can be used) for the duration of the COVID-19 declaration under Section 56 4(b)(1) of the Act, 21 U.S.C. section 360bbb-3(b)(1), unless the  authorization is terminated or revoked sooner. Performed at [facility], [place] [address] [place], [place] [address]       Radiology Studies: Vas [place] Lower Extremity Venous (dvt)  Result Date: 03/25/2019  Lower Venous Study Indications: Stroke.  Comparison Study: no prior Performing Technologist: Abram Sander RVS  Examination Guidelines: A complete evaluation includes B-mode imaging, spectral Doppler, color Doppler, and power Doppler as needed of all accessible portions of each vessel. Bilateral testing is considered an integral part of a complete examination. Limited examinations for reoccurring indications may be performed as noted.  +---------+---------------+---------+-----------+----------+--------------+  RIGHT     Compressibility Phasicity Spontaneity Properties Thrombus Aging  +---------+---------------+---------+-----------+----------+--------------+  CFV       Full            Yes       Yes                                    +---------+---------------+---------+-----------+----------+--------------+  SFJ       Full                                                             +---------+---------------+---------+-----------+----------+--------------+  FV Prox   Full                                                             +---------+---------------+---------+-----------+----------+--------------+  FV Mid    Full                                                             +---------+---------------+---------+-----------+----------+--------------+  FV Distal Full                                                             +---------+---------------+---------+-----------+----------+--------------+  PFV       Full                                                              +---------+---------------+---------+-----------+----------+--------------+  POP       Full            Yes       Yes                                    +---------+---------------+---------+-----------+----------+--------------+  PTV       Full                                                             +---------+---------------+---------+-----------+----------+--------------+  PERO      Full                                                             +---------+---------------+---------+-----------+----------+--------------+   +---------+---------------+---------+-----------+----------+--------------+  LEFT      Compressibility Phasicity Spontaneity Properties Thrombus Aging  +---------+---------------+---------+-----------+----------+--------------+  CFV       Full            Yes       Yes                                    +---------+---------------+---------+-----------+----------+--------------+  SFJ       Full                                                             +---------+---------------+---------+-----------+----------+--------------+  FV Prox   Full                                                             +---------+---------------+---------+-----------+----------+--------------+  FV Mid    Full                                                             +---------+---------------+---------+-----------+----------+--------------+  FV Distal Full                                                             +---------+---------------+---------+-----------+----------+--------------+  PFV       Full                                                             +---------+---------------+---------+-----------+----------+--------------+  POP       Full            Yes       Yes                                    +---------+---------------+---------+-----------+----------+--------------+  PTV       Full                                                              +---------+---------------+---------+-----------+----------+--------------+  PERO      Full                                                             +---------+---------------+---------+-----------+----------+--------------+     Summary: Right: There is no evidence of deep vein thrombosis in the lower extremity. No cystic structure found in the popliteal fossa. Left: There is no evidence of deep vein thrombosis in the lower extremity. No cystic structure found in the popliteal fossa.  *See table(s) above for measurements and observations. Electronically signed by Servando Snare MD on 03/25/2019 at 5:04:25 PM.    Final       LOS: 6 days   Time spent: More than 50% of that time was spent in counseling and/or coordination of care.  Antonieta Pert, MD [facility]  03/27/2019, 9:34 AM

## 2019-03-27 NOTE — Plan of Care (Signed)
  Problem: Education: Goal: Knowledge of General Education information will improve Description: Including pain rating scale, medication(s)/side effects and non-pharmacologic comfort measures Outcome: Progressing   Problem: Clinical Measurements: Goal: Ability to maintain clinical measurements within normal limits will improve Outcome: Progressing Goal: Diagnostic test results will improve Outcome: Progressing Goal: Respiratory complications will improve Outcome: Progressing   Problem: Safety: Goal: Ability to remain free from injury will improve Outcome: Progressing

## 2019-03-27 NOTE — Progress Notes (Signed)
PT Cancellation Note  Patient Details Name: Phillip Owens MRN: [id number] DOB: 07/04/1940   Cancelled Treatment:    Reason Eval/Treat Not Completed: Patient declined, no reason specified Pt declined participating in PT and reports he is going to SNF today. Pt states "i'm not doing anything today". Pt educated about benefits of OOB mobility and that he will likely not receive therapy at SNF today. Pt verbalized understanding but declined mobilizing. PT will continue to follow acutely.    Earney Navy, PTA Acute Rehabilitation Services Pager: [phone] Office: [phone]   03/27/2019, 11:18 AM

## 2019-03-27 NOTE — Progress Notes (Signed)
  Speech Language Pathology Treatment: Dysphagia  Patient Details Name: Phillip Owens MRN: [id number] DOB: 13-Apr-1941 Today's Date: 03/27/2019 Time: [id number] SLP Time Calculation (min) (ACUTE ONLY): 10 min  Assessment / Plan / Recommendation Clinical Impression  Skilled treatment session focused on dysphagia. SLP recieved pt in bed with consuming the last part of breakfast tray. Pt required Max A cues to recall effortful swallow, throat clearing, repeat swallowing and slow rate when consuming puree and honey thick liquids. After recall pt able to perform with Mod A cues with visual demonstration. Pt more distracted by plan to discharge today and what facility he might discharge to. SLP left list of compensatory strategies for pt to take to SNF with him at discharge to promote carryover of safe swallow strategies. Pt left in bed, bed alarm on and all needs within reach. Continue per current plan of care.     HPI HPI: Pt admitted with AMS, aphasia. 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 No associated hemorrhage or mass effect.  Pt also with acute on chronic respiratory failure with hypoxia and hypercapnia, COPD without acute exacerbation:   Patient supposed to be on CPAP at night but was not wearing it.  He was just admitted at [facility] for respiratory failure from 11/29-12/1.  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       SLP Plan  Continue with current plan of care       Recommendations  Diet recommendations: Dysphagia 1 (puree);Honey-thick liquid Liquids provided via: Teaspoon;Cup;No straw Medication Administration: Whole meds with liquid Supervision: Patient able to self  feed;Intermittent supervision to cue for compensatory strategies Compensations: Minimize environmental distractions;Slow rate;Small sips/bites;Effortful swallow Postural Changes and/or Swallow Maneuvers: Upright 30-60 min after meal;Seated upright 90 degrees                Oral Care Recommendations: Oral care BID Follow up Recommendations: Skilled Nursing facility SLP Visit Diagnosis: Dysphagia, oropharyngeal phase (R13.12) Plan: Continue with current plan of care       GO                Zilla Shartzer 03/27/2019, 1:51 PM

## 2019-03-27 NOTE — Progress Notes (Signed)
OT Cancellation Note  Patient Details Name: Phillip Owens MRN: [id number] DOB: 16-Aug-1940   Cancelled Treatment:    Reason Eval/Treat Not Completed: Fatigue/lethargy limiting ability to participate;Other (comment) Pt declined OT session this afternoon despite MAX education on importance of mobilizing with therapies. Pt continue to adamantly decline session, will check back as time allows.   Lanier Clam., COTA/L Acute Rehabilitation Services [phone] [place] 03/27/2019, 4:16 PM

## 2019-03-28 LAB — BASIC METABOLIC PANEL
Anion gap: 9 (ref 5–15)
BUN: 39 mg/dL — ABNORMAL HIGH (ref 8–23)
CO2: 33 mmol/L — ABNORMAL HIGH (ref 22–32)
Calcium: 8.7 mg/dL — ABNORMAL LOW (ref 8.9–10.3)
Chloride: 96 mmol/L — ABNORMAL LOW (ref 98–111)
Creatinine, Ser: 1.66 mg/dL — ABNORMAL HIGH (ref 0.61–1.24)
GFR calc Af Amer: 45 mL/min — ABNORMAL LOW (ref >60)
GFR calc non Af Amer: 39 mL/min — ABNORMAL LOW (ref >60)
Glucose, Bld: 139 mg/dL — ABNORMAL HIGH (ref 70–99)
Potassium: 4.8 mmol/L (ref 3.5–5.1)
Sodium: 138 mmol/L (ref 135–145)

## 2019-03-28 LAB — COMPREHENSIVE METABOLIC PANEL
ALT: 167 U/L — ABNORMAL HIGH (ref 0–44)
AST: 36 U/L (ref 15–41)
Albumin: 2.7 g/dL — ABNORMAL LOW (ref 3.5–5.0)
Alkaline Phosphatase: 67 U/L (ref 38–126)
Anion gap: 10 (ref 5–15)
BUN: 38 mg/dL — ABNORMAL HIGH (ref 8–23)
CO2: 34 mmol/L — ABNORMAL HIGH (ref 22–32)
Calcium: 9 mg/dL (ref 8.9–10.3)
Chloride: 94 mmol/L — ABNORMAL LOW (ref 98–111)
Creatinine, Ser: 1.67 mg/dL — ABNORMAL HIGH (ref 0.61–1.24)
GFR calc Af Amer: 45 mL/min — ABNORMAL LOW (ref >60)
GFR calc non Af Amer: 39 mL/min — ABNORMAL LOW (ref >60)
Glucose, Bld: 114 mg/dL — ABNORMAL HIGH (ref 70–99)
Potassium: 5 mmol/L (ref 3.5–5.1)
Sodium: 138 mmol/L (ref 135–145)
Total Bilirubin: 0.5 mg/dL (ref 0.3–1.2)
Total Protein: 6.8 g/dL (ref 6.5–8.1)

## 2019-03-28 MED ORDER — GUAIFENESIN 200 MG PO TABS
200.0000 mg | ORAL_TABLET | ORAL | Status: DC | PRN
  Administered 2019-03-28 (×2): 200 mg via ORAL
  Filled 2019-03-28 (×4): qty 1

## 2019-03-28 MED ORDER — SODIUM CHLORIDE 0.9 % IV BOLUS: 500.0000 mL | Freq: Once | INTRAVENOUS | Status: DC

## 2019-03-28 NOTE — Plan of Care (Signed)
  Problem: Activity: Goal: Risk for activity intolerance will decrease Outcome: Progressing   Problem: Safety: Goal: Ability to remain free from injury will improve Outcome: Progressing   

## 2019-03-28 NOTE — Progress Notes (Signed)
PROGRESS NOTE    Phillip Owens  [id number] DOB: 1940-08-31 DOA: 03/21/2019 PCP: Patient, No Pcp Per   Brief Narrative:  78 year old male with multiple complex medical history with COPD/chronic hypoxic respiratory failure on 4 L oxygen, smoker, OSA, lung nodule 7.9 mm diverticulosis, internal hemorrhoids, hypertension, diabetes mellitus admitted with altered mental status aphasia.  Found unresponsive at home on 12/4 "history of was called with right eye deviation.  Covid was negative.  PCO2 of 70 on ABG evaluation positive for opiates. He was admitted- MRI showed multiple small acute infarcts, seen by neurology.completed stroke work-up  Subjective:  C/o cough Having meal. No other complaints k improved but creat trending up.  Assessment & Plan:  Toxic metabolic encephalopathy combination of CO2 narcosis and stroke: Now resolved.  Alert,awake, and oriented x3.  Continue supportive care  Bilateral multiple acute infarcts; continue antiplatelet Plavix 75 mg x3 wk and aspirin  81  Mg long-term. Cont  Lipitor 40, and s/p loop recorder>Seen by neurology.  EEG no seizures, LDL 73, hemoglobin A1c 7.4, completed stroke work up  Mandibular cancer status post surgery many years ago.  Complicating his dysphagia. Dysphagia from new stroke, seen by speech continue on dysphagia 1 with honey thick diet, core track removed.  Follow-up with speech outpatient  AKI/hyperkalemia: Creatinine is uptrending further despite iv nss- will increase to 125 ml/hr, k better at 5 s/p lokelma. Cont on ivf, repeate BMP later today and in the morning.  Avoid nephrotoxic medication.  Recent Labs  Lab 03/23/19 1102 03/24/19 0501 03/26/19 0436 03/27/19 0959 03/28/19 0334  BUN 15 17 37* 33* 38*  CREATININE 1.11 1.01 1.38* 1.40* 1.67*   Transaminitis-LFTs down trended AST normal ALT down to 167.Hepatitis serology was negative.  Follow-up outpatient.  DM type 2: BP sugar fairly stable continue sliding scale  insulin.Hemoglobin A1c uncontrolled at 7.4   Hypertension blood pressure is controlled.Continue metoprolol.  Hyperlipidemia goal less than 70 LDL 73.Now on statins. 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  Narrow-angle glaucoma-stable Gout-stable  Nutrition: Nutrition Problem: Inadequate oral intake Etiology: inability to eat Signs/Symptoms: NPO status Interventions: Refer to RD note for recommendations  Pressure Ulcer:  Body mass index is 30.02 kg/m.  Family Communication: plan of care discussed with patient at bedside.  Will update family. Disposition Plan: SNF hopefully tomorrow if creat  and stable  Consultants: Neurology/cardiology Procedures:  Loop recorder implant 12/8 TTE  1. Left ventricular ejection fraction, by visual estimation, is 60 to 65%. The left ventricle has normal function. There is no left ventricular hypertrophy.  2. Global right ventricle has mildly reduced systolic function.The right ventricular size is mildly enlarged. No increase in right ventricular wall thickness.  3. Left atrial size was normal.  4. Right atrial size was normal.  5. The mitral valve is grossly normal. Mild mitral valve regurgitation.  6. The tricuspid valve is grossly normal. Tricuspid valve regurgitation is mild.  7. The aortic valve has an indeterminant number of cusps. Aortic valve regurgitation is trivial.  8. The pulmonic valve was grossly normal. Pulmonic valve regurgitation is not visualized.  9. Moderately elevated pulmonary artery systolic pressure. 10. The tricuspid regurgitant velocity is 3.62 m/s, and with an assumed right atrial pressure of 3 mmHg, the estimated right ventricular systolic pressure is moderately elevated at 55.4 mmHg. 11. The atrial septum is grossly  normal.  Antimicrobials: Anti-infectives (From admission, onward)   None       Objective: Vitals:   03/27/19 2047 03/28/19 0031 03/28/19 0300 03/28/19 0731  BP: 112/63 112/62  118/64  Pulse: 78 80  84  Resp: 18 18  18   Temp: 97.7 F (36.5 C) 98.4 F (36.9 C)  98.3 F (36.8 C)  TempSrc: Oral Oral  Oral  SpO2: 100% 99%  96%  Weight:   100.4 kg   Height:        Intake/Output Summary (Last 24 hours) at 03/28/2019 0753 Last data filed at 03/28/2019 0535 Gross per 24 hour  Intake 1397.01 ml  Output 800 ml  Net 597.01 ml   Filed Weights   03/26/19 0416 03/27/19 0426 03/28/19 0300  Weight: 100.2 kg 100.9 kg 100.4 kg   Weight change: -0.5 kg  Body mass index is 30.02 kg/m.  Intake/Output from previous day: 12/09 0701 - 12/10 0700 In: 1397 [P.O.:660; I.V.:737] Out: 800 [Urine:800] Intake/Output this shift: No intake/output data recorded.  Examination:  General exam: AAOX3, elderly frail and chronically ill looking, on nasal cannula oxygen.   HEENT:Oral mucosa moist, Ear/Nose WNL grossly, dentition normal. Respiratory system: b/l diminished, no wheezing or crackles and no use of accessory muscles.   Cardiovascular system: S1 & S2 +, No JVD. Loop recorder on left chest Gastrointestinal system: Abdomen soft, NT,ND, BS+ Nervous System:Alert, awake, Moving his extremities symmetrical, weakness on the left shoulder chronic Extremities: No edema, distal peripheral pulses palpable. Skin: No rashes,no icterus. MSK: Normal muscle bulk,tone, power  Medications:  Scheduled Meds: . allopurinol  300 mg Oral Daily  . aspirin EC  81 mg Oral Daily  . atorvastatin  40 mg Oral q1800  . brimonidine  1 drop Both Eyes BID  . clopidogrel  75 mg Oral Daily  . dorzolamide  1 drop Both Eyes BID  . ferrous sulfate  325 mg Oral Q breakfast  . fluticasone furoate-vilanterol  1 puff Inhalation Daily   And  . umeclidinium bromide  1 puff Inhalation Daily  . heparin  5,000 Units Subcutaneous Q8H   . ipratropium-albuterol  3 mL Nebulization BID  . lactulose  20 g Oral Daily  . metoprolol tartrate  25 mg Oral Daily  . multivitamin with minerals  1 tablet Oral Daily  . pantoprazole  40 mg Oral Daily  . sodium chloride flush  3 mL Intravenous Q12H   Continuous Infusions: . sodium chloride 75 mL/hr at 03/27/19 1726    Data Reviewed: I have personally reviewed following labs and imaging studies  CBC: Recent Labs  Lab 03/21/19 1215 03/21/19 1310 03/21/19 1758 03/22/19 0539 03/23/19 0433 03/26/19 0436  WBC 12.5*  --   --  8.5 7.5 7.5  NEUTROABS 11.1*  --   --   --  5.6 5.0  HGB 12.7* 12.6* 12.9* 10.9* 11.3* 11.8*  HCT 42.8 37.0* 38.0* 36.0* 38.1* 40.2  MCV 91.5  --   --  89.3 90.3 91.0  PLT 219  --   --  234 237 [id number]   Basic Metabolic Panel: Recent Labs  Lab 03/23/19 1102 03/24/19 0501 03/26/19 0436 03/27/19 0959 03/28/19 0334  NA 138 138 140 137 138  K 4.4 4.9 5.3* 5.5* 5.0  CL 92* 93* 94* 92* 94*  CO2 37* 34* 36* 34* 34*  GLUCOSE 164* 167* 100* 142* 114*  BUN 15 17 37* 33* 38*  CREATININE 1.11 1.01 1.38* 1.40* 1.67*  CALCIUM 9.0 8.9 9.1 9.4 9.0  PHOS  --   --  3.9  --   --    GFR: Estimated Creatinine Clearance: 45.4 mL/min (A) (by C-G formula based on SCr of 1.67 mg/dL (H)). Liver Function Tests: Recent Labs  Lab 03/23/19 1102 03/24/19 0501 03/26/19 0436 03/27/19 0959 03/28/19 0334  AST 639* 327* 98* 56* 36  ALT 870* 621* 314* 223* 167*  ALKPHOS 90 86 78 77 67  BILITOT 0.4 0.3 0.9 0.7 0.5  PROT 7.6 7.0 7.0 7.4 6.8  ALBUMIN 3.0* 2.6* 2.5* 2.7* 2.7*   No results for input(s): LIPASE, AMYLASE in the last 168 hours. Recent Labs  Lab 03/21/19 1346  AMMONIA 23   Coagulation Profile: Recent Labs  Lab 03/21/19 1215 03/23/19 0433  INR 1.3* 1.2   Cardiac Enzymes: Recent Labs  Lab 03/21/19 1346  CKTOTAL 81   BNP (last 3 results) No results for input(s): PROBNP in the last 8760 hours. HbA1C: No results for input(s): HGBA1C in the last 72  hours. CBG: Recent Labs  Lab 03/26/19 0010 03/26/19 0402 03/26/19 0724 03/26/19 1112 03/26/19 1611  GLUCAP 117* 90 85 180* 134*   Lipid Profile: No results for input(s): CHOL, HDL, LDLCALC, TRIG, CHOLHDL, LDLDIRECT in the last 72 hours. Thyroid Function Tests: No results for input(s): TSH, T4TOTAL, FREET4, T3FREE, THYROIDAB in the last 72 hours. Anemia Panel: No results for input(s): VITAMINB12, FOLATE, FERRITIN, TIBC, IRON, RETICCTPCT in the last 72 hours. Sepsis Labs: Recent Labs  Lab 03/21/19 1516 03/21/19 1822  LATICACIDVEN 2.5* 1.5    Recent Results (from the past 240 hour(s))  Culture, blood (routine x 2)     Status: None   Collection Time: 03/21/19  3:16 PM   Specimen: BLOOD RIGHT HAND  Result Value Ref Range Status   Specimen Description BLOOD RIGHT HAND  Final   Special Requests   Final    BOTTLES DRAWN AEROBIC AND ANAEROBIC Blood Culture results may not be optimal due to an inadequate volume of blood received in culture bottles   Culture   Final    NO GROWTH 5 DAYS Performed at [facility], [place] [address] [place], [place] [address]    Report Status 03/26/2019 FINAL  Final  SARS Coronavirus 2 by RT PCR (hospital order, performed in [facility] hospital lab) Nasopharyngeal Nasopharyngeal Swab     Status: None   Collection Time: 03/21/19  3:24 PM   Specimen: Nasopharyngeal Swab  Result Value Ref Range Status   SARS Coronavirus 2 NEGATIVE NEGATIVE Final    Comment: (NOTE) SARS-CoV-2 target nucleic acids are NOT DETECTED. The SARS-CoV-2 RNA is generally detectable in upper and lower respiratory specimens during the acute phase of infection. The lowest concentration of SARS-CoV-2 viral copies this assay can detect is 250 copies / mL. A negative result does not preclude SARS-CoV-2 infection and should not be used as the sole basis for treatment or other patient management decisions.  A negative result may occur with improper specimen collection /  handling, submission of specimen other than nasopharyngeal swab, presence of viral mutation(s) within the areas targeted by this assay, and inadequate number of viral copies (<250 copies / mL). A negative result must be combined with clinical observations, patient history, and epidemiological information. Fact Sheet for Patients:   StrictlyIdeas.no Fact Sheet for Healthcare Providers: BankingDealers.co.za This test is not yet approved or cleared  by the [place] FDA and has been authorized for detection and/or diagnosis of SARS-CoV-2 by FDA under an Emergency Use Authorization (EUA).  This EUA will remain in effect (meaning this test can be used) for the duration of the COVID-19 declaration under Section 564(b)(1) of the Act, 21 U.S.C. section 360bbb-3(b)(1), unless the authorization is terminated or revoked sooner. Performed at [facility], [place] [address] [place], [place] [address]   Culture, blood (routine x 2)     Status: None   Collection Time: 03/21/19  4:05 PM   Specimen: BLOOD LEFT WRIST  Result Value Ref Range Status   Specimen Description BLOOD LEFT WRIST  Final   Special Requests   Final    BOTTLES DRAWN AEROBIC AND ANAEROBIC Blood Culture results may not be optimal due to an inadequate volume of blood received in culture bottles   Culture   Final    NO GROWTH 5 DAYS Performed at [facility], [place] [address] [place], [place] [address]    Report Status 03/26/2019 FINAL  Final  Respiratory Panel by PCR     Status: None   Collection Time: 03/21/19 10:01 PM   Specimen: Nasopharyngeal Swab; Respiratory  Result Value Ref Range Status   Adenovirus NOT DETECTED NOT DETECTED Final   Coronavirus 229E NOT DETECTED NOT DETECTED Final    Comment: (NOTE) The Coronavirus on the Respiratory Panel, DOES NOT test for the novel  Coronavirus (2019 nCoV)    Coronavirus HKU1 NOT DETECTED NOT DETECTED Final   Coronavirus  NL63 NOT DETECTED NOT DETECTED Final   Coronavirus OC43 NOT DETECTED NOT DETECTED Final   Metapneumovirus NOT DETECTED NOT DETECTED Final   Rhinovirus / Enterovirus NOT DETECTED NOT DETECTED Final   Influenza A NOT DETECTED NOT DETECTED Final   Influenza B NOT DETECTED NOT DETECTED Final   Parainfluenza Virus 1 NOT DETECTED NOT DETECTED Final   Parainfluenza Virus 2 NOT DETECTED NOT DETECTED Final   Parainfluenza Virus 3 NOT DETECTED NOT DETECTED Final   Parainfluenza Virus 4 NOT DETECTED NOT DETECTED Final   Respiratory Syncytial Virus NOT DETECTED NOT DETECTED Final   Bordetella pertussis NOT DETECTED NOT DETECTED Final   Chlamydophila pneumoniae NOT DETECTED NOT DETECTED Final   Mycoplasma pneumoniae NOT DETECTED NOT DETECTED Final    Comment: Performed at [facility], [place] [address], [place], [place] [address]  SARS CORONAVIRUS 2 (TAT 6-24 HRS) Nasopharyngeal Nasopharyngeal Swab     Status: None   Collection Time: 03/26/19  1:04 PM   Specimen: Nasopharyngeal Swab  Result Value Ref Range Status   SARS Coronavirus 2 NEGATIVE NEGATIVE Final    Comment: (NOTE) SARS-CoV-2 target nucleic acids are NOT DETECTED. The SARS-CoV-2 RNA is generally detectable in upper and lower respiratory specimens during the acute phase of infection. Negative results do not preclude SARS-CoV-2 infection, do not rule out co-infections with other pathogens, and should not be used as the sole basis for treatment or other patient management decisions. Negative results must be combined with clinical observations, patient history, and epidemiological information. The expected result is Negative. Fact Sheet for Patients: SugarRoll.be Fact Sheet for Healthcare Providers: https://www.woods-mathews.com/ This test is not yet approved or cleared by the [place] FDA and  has been authorized for detection and/or diagnosis of SARS-CoV-2 by FDA under an Emergency Use  Authorization (EUA). This EUA will remain  in effect (meaning this test can be used) for the duration of the COVID-19 declaration under Section 56 4(b)(1) of the Act, 21 U.S.C. section 360bbb-3(b)(1), unless the authorization is terminated or revoked sooner. Performed at [facility], [place]  [address] [place], [place] [address]       Radiology Studies: EP PPM/ICD IMPLANT  Result Date: 03/26/2019 SURGEON:  Thompson Grayer, MD   PREPROCEDURE DIAGNOSIS:  Cryptogenic Stroke   POSTPROCEDURE DIAGNOSIS:  Cryptogenic Stroke    PROCEDURES:  1. Implantable loop recorder implantation   INTRODUCTION:  Zyquan Rizk is a 78 y.o. male with a history of unexplained stroke who presents today for implantable loop implantation.  The patient has had a cryptogenic stroke.  Despite an extensive workup by neurology, no reversible causes have been identified.  he has worn telemetry during which he did not have arrhythmias.  There is significant concern for possible atrial fibrillation as the cause for the patients stroke.  The patient therefore presents today for implantable loop implantation.   DESCRIPTION OF PROCEDURE:  Informed written consent was obtained.  The patient required no sedation for the procedure today.  The patients left chest was prepped and draped. Mapping over the patient's chest was performed to identify the appropriate ILR site.  This area was found to be the left parasternal region over the 3rd-4th intercostal space.  The skin overlying this region was infiltrated with lidocaine for local analgesia.  A 0.5-cm incision was made at the implant site.  A subcutaneous ILR pocket was fashioned using a combination of sharp and blunt dissection.  A Medtronic Reveal Linq model [id number] implantable loop recorder was then placed into the pocket R waves were very prominent and measured > 0.2 mV. EBL<1 ml.  Steri- Strips and a sterile dressing were then applied.  There were no early apparent complications.    CONCLUSIONS:  1. Successful implantation of a Medtronic Reveal LINQ implantable loop recorder for cryptogenic stroke  2. No early apparent complications. Thompson Grayer MD, [facility] 03/26/2019 2:22 PM      LOS: 7 days   Time spent: More than 50% of that time was spent in counseling and/or coordination of care.  Antonieta Pert, MD [facility]  03/28/2019, 7:53 AM

## 2019-03-28 NOTE — Progress Notes (Signed)
SLP Cancellation Note  Patient Details Name: Jermane Lesniewski MRN: [id number] DOB: [birth year]   Cancelled treatment:        Patient lethargic. Arousal to name, but fell back to sleep quickly. Will see at next available date.   Wynelle Bourgeois , MA, CCC-SLP 03/28/2019, 2:06 PM

## 2019-03-28 NOTE — Progress Notes (Signed)
Nutrition Follow-up  DOCUMENTATION CODES:   Obesity unspecified  INTERVENTION:   -Continue 2 Magic cups TID with meals, each supplement provides 290 kcal and 9 grams of protein -Continue MVI with minerals daily -RD will follow for diet advancement and adjust supplement regimen as appropriate  NUTRITION DIAGNOSIS:   Inadequate oral intake related to inability to eat as evidenced by NPO status.  Progressing; advanced to dysphagia 1 diet with honey thick liquids and cortrak tube removed on 03/25/19  GOAL:   Patient will meet greater than or equal to 90% of their needs  Progressing   MONITOR:   PO intake, Supplement acceptance, Labs, Weight trends, Skin, I & O's  REASON FOR ASSESSMENT:   Other (Comment)    ASSESSMENT:   Phillip Owens is a 78 y.o. 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  Patient was last seen normal at approximately of 2330 last night when he went to bed.  Family found him in the a.m. due to being altered. EMS was called.  On EMS arrival patient was agitated and apparently fighting EMS staff when attempting to move his legs and arms for assessment.  In route it appeared to EMS he was having difficulty comprehending his words and he was answering "yes" to questions.  He was evaluated in the emergency room.  The ER doctor called neuro hospitalist on call about patient being outside the TPA window but being aphasic.  Code stroke activation was advised by the neuro hospitalist..  Reviewed I/O's: +597 ml x 24 hours and +1.1 L since admission  UOP: 800 ml x 24 hours  Per SLP notes, plan to remain on current diet of dysphagia 1 with honey thick liquids. Pt easily distracted, due to potential discharge. Noted discharge held  yesterday due to hyperkalemia.   Oral intake has improved; noted meal completion 15-75%. Hopeful for improved oral intake with diet advancement. Supplement options limited secondary to honey thick liquids.   Plan to d/c to SNF once medically stable.   Labs reviewed: K: 5.5. CBGS: 134.   Diet Order:   Diet Order            Diet - low sodium heart healthy        DIET - DYS 1 Room service appropriate? No; Fluid consistency: Honey Thick  Diet effective now              EDUCATION NEEDS:   Not appropriate for education at this time  Skin:  Skin Assessment: Reviewed RN Assessment  Last BM:  03/25/19  Height:   Ht Readings from Last 1 Encounters:  03/21/19 6' (1.829 m)    Weight:   Wt Readings from Last 1 Encounters:  03/28/19 100.4 kg    Ideal Body Weight:  80.9 kg  BMI:  Body mass index is 30.02 kg/m.  Estimated Nutritional Needs:   Kcal:  2000-2200  Protein:  105-120 grams  Fluid:  > 2 L    Oluwatobi Ruppe A. Jimmye Norman, RD, LDN, [place] Registered Dietitian II Certified Diabetes Care and Education Specialist Pager: [phone] After hours Pager: [phone]

## 2019-03-29 LAB — COMPREHENSIVE METABOLIC PANEL
ALT: 126 U/L — ABNORMAL HIGH (ref 0–44)
AST: 29 U/L (ref 15–41)
Albumin: 2.7 g/dL — ABNORMAL LOW (ref 3.5–5.0)
Alkaline Phosphatase: 71 U/L (ref 38–126)
Anion gap: 8 (ref 5–15)
BUN: 37 mg/dL — ABNORMAL HIGH (ref 8–23)
CO2: 34 mmol/L — ABNORMAL HIGH (ref 22–32)
Calcium: 8.8 mg/dL — ABNORMAL LOW (ref 8.9–10.3)
Chloride: 98 mmol/L (ref 98–111)
Creatinine, Ser: 1.42 mg/dL — ABNORMAL HIGH (ref 0.61–1.24)
GFR calc Af Amer: 55 mL/min — ABNORMAL LOW (ref >60)
GFR calc non Af Amer: 47 mL/min — ABNORMAL LOW (ref >60)
Glucose, Bld: 145 mg/dL — ABNORMAL HIGH (ref 70–99)
Potassium: 5.1 mmol/L (ref 3.5–5.1)
Sodium: 140 mmol/L (ref 135–145)
Total Bilirubin: 0.6 mg/dL (ref 0.3–1.2)
Total Protein: 6.9 g/dL (ref 6.5–8.1)

## 2019-03-29 MED ORDER — CLOPIDOGREL BISULFATE 75 MG PO TABS: 75.0000 mg | ORAL_TABLET | Freq: Every day | ORAL | Status: AC

## 2019-03-29 NOTE — Progress Notes (Signed)
0910 informed tele of discharge orders, ok to no longer monitor patient.   Patient being discharged to [facility] Notified by SW at 1114 no longer need COVID re-swab.   Called report to [place] at 1453 to [facility]. Informed of loop recorder, CPAP, 3-3.5L [place], Dys 1, no DME per PT, left flank/abd pain. Informed per RT note, patient setting 3 and nasal mask large for CPAP   PTAR arrive 1710; RN to bedside.

## 2019-03-29 NOTE — Progress Notes (Signed)
Physical Therapy Treatment Patient Details Name: Phillip Owens MRN: [id number] DOB: 1940/07/13 Today's Date: 03/29/2019    History of Present Illness Patient is a 78 year old male admitted with AMS, aphasia. 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 No associated hemorrhage or mass effect.  Pt also with acute on chronic respiratory failure with hypoxia and hypercapnia, COPD without acute exacerbation:   Patient supposed to be on CPAP at night but was not wearing it.  He was just admitted at [facility] for respiratory failure from 11/29-12/1.     PT Comments    Patient progressing slowly towards PT goals. Requires encouragement and coaxing to participate in mobility. Tolerated short distance ambulation in room with Min A for safety. Initially Mod A progressing to Min guard assist to stand. Slow to process. VSS throughout on 3L/min 02. Eager to d/c today. Will follow.   Follow Up Recommendations  SNF;Supervision for mobility/OOB     Equipment Recommendations  None recommended by PT    Recommendations for Other Services       Precautions / Restrictions Precautions Precautions: Fall Restrictions Weight Bearing Restrictions: No    Mobility  Bed Mobility Overal bed mobility: Needs Assistance Bed Mobility: Supine to Sit     Supine to sit: Mod assist;HOB elevated     General bed mobility comments: Assist with trunk and to scoot bips to EOB. Increased time/effort.  Transfers Overall transfer level: Needs assistance Equipment used: Rolling walker (2 wheeled) Transfers: Sit to/from [facility] Sit to Stand: Mod assist;From elevated surface;Min guard Stand pivot transfers: Min assist       General transfer comment: Initially Mod A to power up to standing from EOB progressing to Min guard assist from lower BSC with arm rests to push from;  transferred to chair post ambulation. SPT bed to [facility] with increased time, directional cues.  Ambulation/Gait Ambulation/Gait assistance: Min guard Gait Distance (Feet): 18 Feet Assistive device: Rolling walker (2 wheeled) Gait Pattern/deviations: Step-to pattern;Shuffle Gait velocity: reduced Gait velocity interpretation: <1.31 ft/sec, indicative of household ambulator General Gait Details: Slow, mildly unsteady and guarded gait. SP02 stable in mid 90s on 3L/min 02 [place].   Stairs             Wheelchair Mobility    Modified Rankin (Stroke Patients Only) Modified Rankin (Stroke Patients Only) Pre-Morbid Rankin Score: Slight disability Modified Rankin: Moderately severe disability     Balance Overall balance assessment: Needs assistance Sitting-balance support: Feet supported;No upper extremity supported Sitting balance-Leahy Scale: Fair     Standing balance support: During functional activity Standing balance-Leahy Scale: Poor Standing balance comment: reliant on BUE support                            Cognition Arousal/Alertness: Awake/alert Behavior During Therapy: WFL for tasks assessed/performed Overall Cognitive Status: Within Functional Limits for tasks assessed                                 General Comments: appears slow to process but overall [facility]      Exercises      General Comments General comments (skin integrity, edema, etc.): Sp02 remained >92% on 3L/min 02 [place].      Pertinent Vitals/Pain Pain Assessment: Faces Faces Pain Scale: Hurts a little bit  Pain Location: left side Pain Descriptors / Indicators: Sore Pain Intervention(s): Repositioned;Monitored during session    Home Living                      Prior Function            PT Goals (current goals can now be found in the care plan section) Progress towards PT goals: Progressing toward goals    Frequency    Min 3X/week      PT Plan Current  plan remains appropriate    Co-evaluation              AM-PAC PT "6 Clicks" Mobility   Outcome Measure  Help needed turning from your back to your side while in a flat bed without using bedrails?: A Little Help needed moving from lying on your back to sitting on the side of a flat bed without using bedrails?: A Lot Help needed moving to and from a bed to a chair (including a wheelchair)?: A Little Help needed standing up from a chair using your arms (e.g., wheelchair or bedside chair)?: A Lot Help needed to walk in hospital room?: A Little Help needed climbing 3-5 steps with a railing? : A Lot 6 Click Score: 15    End of Session Equipment Utilized During Treatment: Oxygen;Gait belt Activity Tolerance: Patient tolerated treatment well Patient left: in chair;with call bell/phone within reach;with chair alarm set Nurse Communication: Mobility status PT Visit Diagnosis: Muscle weakness (generalized) (M62.81);Difficulty in walking, not elsewhere classified (R26.2);Dizziness and giddiness (R42)     Time: 0830-0900 PT Time Calculation (min) (ACUTE ONLY): 30 min  Charges:  $Therapeutic Activity: 23-37 mins                     Marisa Severin, PT, DPT Acute Rehabilitation Services Pager [phone] Office [phone]       Marguarite Arbour A Sabra Heck 03/29/2019, 9:40 AM

## 2019-03-29 NOTE — Progress Notes (Addendum)
Phillip Owens to be D/C'd Skilled nursing facility per MD order.  Discussed with the patient and all questions fully answered.  VSS, Skin clean, dry and intact without evidence of skin break down, no evidence of skin tears noted.  IV catheter discontinued intact. Site without signs and symptoms of complications. Dressing and pressure applied.  An After Visit Summary was printed and given to PTAR.  D/c education completed with patient/family including follow up instructions, medication list, d/c activities limitations if indicated, with other d/c instructions as indicated by MD - patient able to verbalize understanding, all questions fully answered.   Patient instructed to return to ED, call 911, or call MD for any changes in condition.   Patient escorted via stretcher by Corey Harold  ([place] arrived [phone])   Howard Pouch 03/29/2019 9:37 PM

## 2019-03-29 NOTE — Progress Notes (Addendum)
Patient will transferred this afternoon and the Charge Nurse requested Home CPAP settings to forward to the new facility. The patient setting is 3. Nasal Mask size Large.

## 2019-03-29 NOTE — TOC Transition Note (Signed)
Transition of Care [facility]) - CM/SW Discharge Note   Patient Details  Name: Phillip Owens MRN: [id number] Date of Birth: [birth year]  Transition of Care [place]) CM/SW Contact:  Gelene Mink, [place] Phone Number: 03/29/2019, 1:11 PM   Clinical Narrative:     Patient will DC to:  [place] Anticipated DC date: 03/29/2019 Family notified: Yes Transport by: Corey Harold   Per MD patient ready for DC to . RN, patient, patient's family, and facility notified of DC. Discharge Summary and FL2 sent to facility. RN to call report prior to discharge [phone]). The patient will go room 203.  DC packet on chart. Ambulance transport requested for patient.   CSW will sign off for now as social work intervention is no longer needed. Please consult [place] again if new needs arise.  Ekam Besson, LCSW-A [place]/Clinical Social Work Department Cell: [phone]    Final next level of care: [place] Barriers to Discharge: No Barriers Identified   Patient Goals and CMS Choice Patient states their goals for this hospitalization and ongoing recovery are:: Pt will go to rehab CMS Medicare.gov Compare Post Acute Care list provided to:: Patient Represenative (must comment) Choice offered to / list presented to : Adult Children  Discharge Placement PASRR number recieved: 03/22/19 Existing PASRR number confirmed : 03/22/19          Patient chooses bed at: [facility] Patient to be transferred to facility by: [place] Name of family member notified: Otila Kluver Patient and family notified of of transfer: 03/29/19  Discharge Plan and Services     Post Acute Care Choice: [place]          DME Arranged: N/A DME Agency: NA       HH Arranged: NA          Social Determinants of Health (SDOH) Interventions     Readmission Risk Interventions No flowsheet data found.

## 2019-03-29 NOTE — Discharge Summary (Signed)
Physician Discharge Summary  Linus Comito [id number] DOB: [birth year] DOA: 03/21/2019  PCP: Patient, No Pcp Per  Admit date: 03/21/2019 Discharge date: 03/29/2019  Admitted From: home Disposition:  SNF  Recommendations for Outpatient Follow-up:  1. Follow up with PCP in 1-2 weeks 2. Please obtain BMP/CBC in one week 3. Please follow up on the following pending results:  Home Health: NO  Equipment/Devices: O2 3L-4l Delta  Discharge Condition: Stable CODE STATUS: FULL Diet recommendation: Heart Healthy, diabetic Cont  dysphagia 1 with honey thick diet,   Brief/Interim Summary:  78 year old male with multiple complex medical history with COPD/chronic hypoxic respiratory failure on 4 L oxygen, smoker, OSA, lung nodule 7.9 mm diverticulosis, internal hemorrhoids, hypertension, diabetes mellitus admitted with altered mental status aphasia.  Found unresponsive at home on 12/4 "history of was called with right eye deviation.  Covid was negative.  PCO2 of 70 on ABG evaluation positive for opiates. He was admitted- MRI showed multiple small acute infarcts, seen by neurology.completed stroke work-up  Assessment & Plan: Discharge diagnoses  Toxic metabolic encephalopathy combination of CO2 narcosis and stroke: Now resolved.  Alert,awake, and oriented x3.  Continue supportive care  Bilateral multiple acute infarcts; continue antiplatelet Plavix 75 mg x3 wk and aspirin  81  Mg long-term. Cont  Lipitor 40, and s/p loop recorder>Seen by neurology.  EEG no seizures, LDL 73, hemoglobin A1c 7.4, completed stroke work up  Mandibular cancer status post surgery many years ago.  Complicating his dysphagia. Dysphagia from new stroke, seen by speech continue on dysphagia 1 with honey thick diet, core track removed.  Follow-up with speech outpatient  AKI/hyperkalemia: Creatinine is uptrending further despite iv nss-  Resolved with ivf. k better- can use prn lokelma or kayexalate at SNF.Avoid nephrotoxic  medication. Recent Labs  Lab 03/26/19 0436 03/27/19 0959 03/28/19 0334 03/28/19 1621 03/29/19 0357  BUN 37* 33* 38* 39* 37*  CREATININE 1.38* 1.40* 1.67* 1.66* 1.42*  check bmp in 5 days  Transaminitis-LFTs down trended AST normal ALT down to 167.Hepatitis serology was negative.  Follow-up outpatient. Check LFTS at SNF in 5 days  DM type 2: BP sugar fairly stable continue home meds.Hemoglobin A1c uncontrolled at 7.4   Hypertension blood pressure is controlled.Continue metoprolol.  Hyperlipidemia goal less than 70 LDL 73.Now on statins. 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  Narrow-angle glaucoma-stable Gout-stable  Body mass index is 30.02 kg/m.  Family Communication: plan of care discussed with patient at bedside. Disposition Plan: SNF today  Consultants: Neurology/cardiology Procedures:  Loop recorder implant 12/8 TTE 1. Left ventricular ejection fraction, by visual estimation, is 60 to 65%. The left ventricle has normal function. There is no left ventricular hypertrophy. 2. Global right ventricle has mildly reduced systolic function.The right ventricular size is mildly enlarged. No increase in right ventricular wall thickness. 3. Left atrial size was normal. 4. Right atrial size was normal. 5. The mitral valve is grossly normal. Mild mitral valve regurgitation. 6. The tricuspid valve is grossly normal. Tricuspid valve regurgitation is mild. 7. The aortic valve has an indeterminant number of cusps. Aortic valve regurgitation is trivial. 8. The pulmonic valve was grossly normal. Pulmonic valve regurgitation is not visualized. 9. Moderately elevated pulmonary artery systolic pressure. 10. The tricuspid regurgitant velocity is 3.62 m/s, and with an assumed right atrial  pressure of 3 mmHg, the estimated  right ventricular systolic pressure is moderately elevated at 55.4 mmHg. 11. The atrial septum is grossly normal. Antimicrobials:    Anti-infectives (From admission, onward       Discharge Diagnoses:  Principal Problem:   Acute metabolic encephalopathy Active Problems:   Acute on chronic respiratory failure with hypoxia and hypercapnia (HCC)   SIRS (systemic inflammatory response syndrome) (HCC)   Aphasia   Falls   COPD (chronic obstructive pulmonary disease) (HCC)   AKI (acute kidney injury) (HCC)   Generalized weakness   OSA (obstructive sleep apnea)   Cerebral embolism with cerebral infarction    Discharge Instructions  Discharge Instructions    Diet - low sodium heart healthy   Complete by: As directed    Cont  dysphagia 1 with honey thick diet,   Discharge instructions   Complete by: As directed    Please call call MD or return to ER for similar or worsening recurring problem that brought you to hospital or if any fever,nausea/vomiting,abdominal pain, uncontrolled pain, chest pain,  shortness of breath or any other alarming symptoms.  Cont  dysphagia 1 with honey thick diet,  Please follow-up your doctor as instructed in a week time and call the office for appointment.  Please avoid alcohol, smoking, or any other illicit substance and maintain healthy habits including taking your regular medications as prescribed.  You were cared for by a hospitalist during your hospital stay. If you have any questions about your discharge medications or the care you received while you were in the hospital after you are discharged, you can call the unit and ask to speak with the hospitalist on call if the hospitalist that took care of you is not available.  Once you are discharged, your primary care physician will handle any further medical issues. Please note that NO REFILLS for any discharge medications will be authorized once you are discharged, as  it is imperative that you return to your primary care physician (or establish a relationship with a primary care physician if you do not have one) for your aftercare needs so that they can reassess your need for medications and monitor your lab values  CHECK BMP in 5 days   Increase activity slowly   Complete by: As directed    Increase activity slowly   Complete by: As directed      Allergies as of 03/29/2019   No Known Allergies     Medication List    STOP taking these medications   doxycycline 100 MG tablet Commonly known as: VIBRA-TABS   zolpidem 10 MG tablet Commonly known as: AMBIEN     TAKE these medications   allopurinol 300 MG tablet Commonly known as: ZYLOPRIM Take 300 mg by mouth daily.   amLODipine 10 MG tablet Commonly known as: NORVASC Take 10 mg by mouth daily.   aspirin EC 81 MG tablet Take 81 mg by mouth daily.   atorvastatin 40 MG tablet Commonly known as: LIPITOR Take 1 tablet (40 mg total) by mouth daily at 6 PM.   brimonidine 0.2 % ophthalmic solution Commonly known as: ALPHAGAN Place 1 drop into both eyes 2 (two) times daily.   cholecalciferol 25 MCG (1000 UT) tablet Commonly known as: VITAMIN D3 Take 1,000 Units by mouth daily.   clopidogrel 75 MG tablet Commonly known as: PLAVIX Take 1 tablet (75 mg total) by mouth daily for 21 days. For 3 wk then stop   dorzolamide 2 % ophthalmic solution Commonly known as: TRUSOPT Place 1  drop into both eyes 2 (two) times daily.   ferrous sulfate 325 (65 FE) MG tablet Take 325 mg by mouth daily with breakfast.   fluticasone furoate-vilanterol 100-25 MCG/INH Aepb Commonly known as: BREO ELLIPTA Inhale 1 puff into the lungs daily.   latanoprost 0.005 % ophthalmic solution Commonly known as: XALATAN Place 1 drop into both eyes at bedtime.   lovastatin 20 MG tablet Commonly known as: MEVACOR Take 20 mg by mouth at bedtime.   metoprolol tartrate 25 MG tablet Commonly known as: LOPRESSOR Take  25 mg by mouth daily.   omeprazole 40 MG capsule Commonly known as: PRILOSEC Take 40 mg by mouth daily.   Resource ThickenUp Clear Powd Take 1 Container by mouth as needed.   Trelegy Ellipta 100-62.5-25 MCG/INH Aepb Generic drug: Fluticasone-Umeclidin-Vilant Take 1 puff by mouth daily.   umeclidinium bromide 62.5 MCG/INH Aepb Commonly known as: INCRUSE ELLIPTA Inhale 1 puff into the lungs daily.       Contact information for follow-up providers    [facility].   Specialty: Cardiology Why: 04/09/2019 @ 11:30AM, wound check visit Contact information: [address], [place] [phone]           Contact information for after-discharge care    [facility] .   Service: Skilled Nursing Contact information: [address] [place] [phone]                 No Known Allergies    Procedures/Studies: EEG  Result Date: 03/21/2019 Lora Havens, MD     03/21/2019  4:57 PM Patient Name: Jansel Deary MRN: [id number] Epilepsy Attending: Lora Havens Referring Physician/Provider: Dr. Fuller Plan Date: 03/21/2019 Duration: 23 minutes Patient history: 79 year old male with altered mental status.  EEG to evaluate for seizures. Level of alertness: Awake AEDs during EEG study: None Technical aspects: This EEG study was done with scalp electrodes positioned according to the 10-20 International system of electrode placement. Electrical activity was acquired at a sampling rate of 500Hz  and reviewed with a high frequency filter of 70Hz  and a low frequency filter of 1Hz . EEG data were recorded continuously and digitally stored. Description: During awake state, no clear posterior dominant rhythm was seen.  EEG showed continuous generalized 3 to 6 Hz theta-delta slowing.  Hyperventilation and photic stimulation were not performed.  Abnormality -Continued slow, generalized IMPRESSION: This study is suggestive of moderate diffuse encephalopathy, nonspecific to etiology. No seizures or epileptiform discharges were seen throughout the recording. Lora Havens   CT Code Stroke CTA Head W/WO contrast  Result Date: 03/21/2019 CLINICAL DATA:  Altered mental status.  Aphasia. EXAM: CT ANGIOGRAPHY HEAD AND NECK CT PERFUSION BRAIN TECHNIQUE: Multidetector CT imaging of the head and neck was performed using the standard protocol during bolus administration of intravenous contrast. Multiplanar CT image reconstructions and MIPs were obtained to evaluate the vascular anatomy. Carotid stenosis measurements (when applicable) are obtained utilizing NASCET criteria, using the distal internal carotid diameter as the denominator. Multiphase CT imaging of the brain was performed following IV bolus contrast injection. Subsequent parametric perfusion maps were calculated using RAPID software. CONTRAST:  115mL OMNIPAQUE IOHEXOL 350 MG/ML SOLN COMPARISON:  Head CT earlier same day FINDINGS: CTA NECK FINDINGS Aortic arch: Aortic atherosclerosis. No aneurysm or dissection. Branching pattern is normal without origin stenosis. Right carotid system: Common carotid artery widely patent to the bifurcation. Calcified  plaque at the carotid bifurcation and ICA bulb. No stenosis when compared to the more distal cervical ICA diameter. Some plaque affecting the distal cervical ICA but no stenosis. Left carotid system: Common carotid artery widely patent to the bifurcation. Calcified plaque at the carotid bifurcation and ICA bulb but no stenosis. Distal cervical ICA shows some plaque but no stenosis. Vertebral arteries: Both vertebral artery origins are widely patent. Both vertebral arteries appear approximately equal in size an widely patent through the cervical region to the foramen magnum. Skeleton: Minimal cervical spondylosis. Other neck: No mass or lymphadenopathy.  Upper chest: Emphysema and pulmonary scarring.  No focal lesion. Review of the MIP images confirms the above findings CTA HEAD FINDINGS Anterior circulation: Both internal carotid arteries are patent through the skull base and siphon regions. Atherosclerotic calcification within the carotid siphon regions. Stenosis estimated at 50% on each side. The anterior and middle cerebral vessels are patent without proximal stenosis, aneurysm vascular malformation. No large or medium vessel occlusion is identified. Primary anterior circulation supply to both posterior cerebral arteries. Posterior circulation: Both vertebral arteries are patent through the foramen magnum to the basilar. Basilar artery is small but does not show focal stenosis. Both superior cerebellar arteries show flow. As noted above, both posterior cerebral arteries receive the majority of there supply from the anterior circulation. Venous sinuses: Patent and normal. Anatomic variants: None significant otherwise. Review of the MIP images confirms the above findings CT Brain Perfusion Findings: ASPECTS: 10 CBF (<30%) Volume: 45mL Perfusion (Tmax>6.0s) volume: 34mL Mismatch Volume: 68mL Infarction Location:None present IMPRESSION: Normal CT perfusion study. Atherosclerotic change at both carotid bifurcations but no stenosis. Atherosclerotic change in the carotid siphon regions without stenosis greater than 50%. No intracranial large or medium vessel occlusion or correctable proximal stenosis. Aortic atherosclerosis. These results were communicated to Dr. Rory Percy at 12:59 pmon 12/3/2020by text page via the [facility] messaging system. Electronically Signed   By: Nelson Chimes M.D.   On: 03/21/2019 13:01   DG Abd 1 View  Result Date: 03/22/2019 CLINICAL DATA:  Transaminitis EXAM: ABDOMEN - 1 VIEW COMPARISON:  None. FINDINGS: The bowel gas pattern is normal. No radio-opaque calculi or other significant radiographic abnormality are seen. IMPRESSION: Negative.  Electronically Signed   By: Prudencio Pair M.D.   On: 03/22/2019 00:40   CT HEAD WO CONTRAST  Result Date: 03/21/2019 CLINICAL DATA:  Altered mental status and aphasia. EXAM: CT HEAD WITHOUT CONTRAST TECHNIQUE: Contiguous axial images were obtained from the base of the skull through the vertex without intravenous contrast. COMPARISON:  None. FINDINGS: Brain: No evidence of acute infarction, hemorrhage, hydrocephalus, extra-axial collection or mass lesion/mass effect. 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 There is mild diffuse low-attenuation within the subcortical and periventricular white matter compatible with chronic microvascular disease. Vascular: No hyperdense vessel or unexpected calcification. Skull: Normal. Negative for fracture or focal lesion. Sinuses/Orbits: Paranasal sinuses are clear. Mastoid air cells are clear. Post op changes involving both orbits noted. Other: None IMPRESSION: 1. No acute intracranial abnormalities. 2. Chronic small vessel ischemic change . Electronically Signed   By: Kerby Moors M.D.   On: 03/21/2019 12:08   CT Code Stroke CTA Neck W/WO contrast  Result Date: 03/21/2019 CLINICAL DATA:  Altered mental status.  Aphasia. EXAM: CT ANGIOGRAPHY HEAD AND NECK CT PERFUSION BRAIN TECHNIQUE: Multidetector CT imaging of the head and neck was performed using the standard protocol during bolus administration of intravenous contrast. Multiplanar CT image reconstructions and MIPs were obtained to evaluate  the vascular anatomy. Carotid stenosis measurements (when applicable) are obtained utilizing NASCET criteria, using the distal internal carotid diameter as the denominator. Multiphase CT imaging of the brain was performed following IV bolus contrast injection. Subsequent parametric perfusion maps were calculated using RAPID software. CONTRAST:  131mL OMNIPAQUE IOHEXOL 350 MG/ML SOLN COMPARISON:  Head CT earlier same day FINDINGS: CTA NECK FINDINGS Aortic arch: Aortic  atherosclerosis. No aneurysm or dissection. Branching pattern is normal without origin stenosis. Right carotid system: Common carotid artery widely patent to the bifurcation. Calcified plaque at the carotid bifurcation and ICA bulb. No stenosis when compared to the more distal cervical ICA diameter. Some plaque affecting the distal cervical ICA but no stenosis. Left carotid system: Common carotid artery widely patent to the bifurcation. Calcified plaque at the carotid bifurcation and ICA bulb but no stenosis. Distal cervical ICA shows some plaque but no stenosis. Vertebral arteries: Both vertebral artery origins are widely patent. Both vertebral arteries appear approximately equal in size an widely patent through the cervical region to the foramen magnum. Skeleton: Minimal cervical spondylosis. Other neck: No mass or lymphadenopathy. Upper chest: Emphysema and pulmonary scarring.  No focal lesion. Review of the MIP images confirms the above findings CTA HEAD FINDINGS Anterior circulation: Both internal carotid arteries are patent through the skull base and siphon regions. Atherosclerotic calcification within the carotid siphon regions. Stenosis estimated at 50% on each side. The anterior and middle cerebral vessels are patent without proximal stenosis, aneurysm vascular malformation. No large or medium vessel occlusion is identified. Primary anterior circulation supply to both posterior cerebral arteries. Posterior circulation: Both vertebral arteries are patent through the foramen magnum to the basilar. Basilar artery is small but does not show focal stenosis. Both superior cerebellar arteries show flow. As noted above, both posterior cerebral arteries receive the majority of there supply from the anterior circulation. Venous sinuses: Patent and normal. Anatomic variants: None significant otherwise. Review of the MIP images confirms the above findings CT Brain Perfusion Findings: ASPECTS: 10 CBF (<30%) Volume: 19mL  Perfusion (Tmax>6.0s) volume: 45mL Mismatch Volume: 26mL Infarction Location:None present IMPRESSION: Normal CT perfusion study. Atherosclerotic change at both carotid bifurcations but no stenosis. Atherosclerotic change in the carotid siphon regions without stenosis greater than 50%. No intracranial large or medium vessel occlusion or correctable proximal stenosis. Aortic atherosclerosis. These results were communicated to Dr. Rory Percy at 12:59 pmon 12/3/2020by text page via the [facility] messaging system. Electronically Signed   By: Nelson Chimes M.D.   On: 03/21/2019 13:01   MR BRAIN WO CONTRAST  Result Date: 03/22/2019 CLINICAL DATA:  78 year old male with altered mental status. EXAM: MRI HEAD WITHOUT CONTRAST TECHNIQUE: Multiplanar, multiecho pulse sequences of the brain and surrounding structures were obtained without intravenous contrast. COMPARISON:  CT head, CTA head and neck and CT Perfusion 03/21/2019. [facility] brain MRI 07/29/2016. FINDINGS: Brain: Patchy and scattered restricted diffusion in the bilateral cerebellar hemispheres slightly greater on the right (series 5, image 57). Sparing of the brainstem. But there is punctate restricted diffusion at the bilateral lentiform/genu of the internal capsules (series 5, image 74). There is also a small area of restricted diffusion in the medial left periatrial white matter near the tail of the left hippocampus on series 5, image 71. No definite hippocampal diffusion abnormality. There is also a tiny cortical focus of restricted diffusion in the superior right parietal lobe (series 7, image 44). Associated T2 and FLAIR hyperintensity at the areas of acute involvement. No acute hemorrhage or mass effect.  Stable chronic lacunar infarct in the right basal ganglia, right pons. Chronic microhemorrhages in the pons are stable since 2018. Patchy and confluent bilateral cerebral white matter T2 and FLAIR hyperintensity is stable to mildly progressed since 2018. No  superimposed midline shift, mass effect, evidence of mass lesion, ventriculomegaly, extra-axial collection or acute intracranial hemorrhage. Cervicomedullary junction and pituitary are within normal limits. Vascular: Major intracranial vascular flow voids are stable since 2018. Skull and upper cervical spine: Negative visible cervical spine. Visualized bone marrow signal is within normal limits. Sinuses/Orbits: Stable and negative. Other: Mastoids remain clear. Visible internal auditory structures appear normal. IMPRESSION: 1. Multiple small acute infarcts, most pronounced in the bilateral cerebellum but also punctate foci in the bilateral basal ganglia, right parietal lobe. No associated hemorrhage or mass effect. 2. The pattern might indicate a recent embolic event. But synchronous small vessel disease is also a possibility (see #3). 3. 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 Electronically Signed   By: Genevie Ann M.D.   On: 03/22/2019 01:13   EP PPM/ICD IMPLANT  Result Date: 03/26/2019 SURGEON:  Thompson Grayer, MD   PREPROCEDURE DIAGNOSIS:  Cryptogenic Stroke   POSTPROCEDURE DIAGNOSIS:  Cryptogenic Stroke    PROCEDURES:  1. Implantable loop recorder implantation   INTRODUCTION:  Kyaw Zarn is a 78 y.o. male with a history of unexplained stroke who presents today for implantable loop implantation.  The patient has had a cryptogenic stroke.  Despite an extensive workup by neurology, no reversible causes have been identified.  he has worn telemetry during which he did not have arrhythmias.  There is significant concern for possible atrial fibrillation as the cause for the patients stroke.  The patient therefore presents today for implantable loop implantation.   DESCRIPTION OF PROCEDURE:  Informed written consent was obtained.  The patient required no sedation for the procedure today.  The patients left  chest was prepped and draped. Mapping over the patient's chest was performed to identify the appropriate ILR site.  This area was found to be the left parasternal region over the 3rd-4th intercostal space.  The skin overlying this region was infiltrated with lidocaine for local analgesia.  A 0.5-cm incision was made at the implant site.  A subcutaneous ILR pocket was fashioned using a combination of sharp and blunt dissection.  A Medtronic Reveal Linq model [id number] implantable loop recorder was then placed into the pocket R waves were very prominent and measured > 0.2 mV. EBL<1 ml.  Steri- Strips and a sterile dressing were then applied.  There were no early apparent complications.   CONCLUSIONS:  1. Successful implantation of a Medtronic Reveal LINQ implantable loop recorder for cryptogenic stroke  2. No early apparent complications. Thompson Grayer MD, [facility] 03/26/2019 2:22 PM   CT Code Stroke Cerebral Perfusion with contrast  Result Date: 03/21/2019 CLINICAL DATA:  Altered mental status.  Aphasia. EXAM: CT ANGIOGRAPHY HEAD AND NECK CT PERFUSION BRAIN TECHNIQUE: Multidetector CT imaging of the head and neck was performed using the standard protocol during bolus administration of intravenous contrast. Multiplanar CT image reconstructions and MIPs were obtained to evaluate the vascular anatomy. Carotid stenosis measurements (when applicable) are obtained utilizing NASCET criteria, using the distal internal carotid diameter as the denominator. Multiphase CT imaging of the brain was performed following IV bolus contrast injection. Subsequent parametric perfusion maps were calculated using RAPID software. CONTRAST:  136mL OMNIPAQUE IOHEXOL 350 MG/ML SOLN  COMPARISON:  Head CT earlier same day FINDINGS: CTA NECK FINDINGS Aortic arch: Aortic atherosclerosis. No aneurysm or dissection. Branching pattern is normal without origin stenosis. Right carotid system: Common carotid artery widely patent to the bifurcation. Calcified  plaque at the carotid bifurcation and ICA bulb. No stenosis when compared to the more distal cervical ICA diameter. Some plaque affecting the distal cervical ICA but no stenosis. Left carotid system: Common carotid artery widely patent to the bifurcation. Calcified plaque at the carotid bifurcation and ICA bulb but no stenosis. Distal cervical ICA shows some plaque but no stenosis. Vertebral arteries: Both vertebral artery origins are widely patent. Both vertebral arteries appear approximately equal in size an widely patent through the cervical region to the foramen magnum. Skeleton: Minimal cervical spondylosis. Other neck: No mass or lymphadenopathy. Upper chest: Emphysema and pulmonary scarring.  No focal lesion. Review of the MIP images confirms the above findings CTA HEAD FINDINGS Anterior circulation: Both internal carotid arteries are patent through the skull base and siphon regions. Atherosclerotic calcification within the carotid siphon regions. Stenosis estimated at 50% on each side. The anterior and middle cerebral vessels are patent without proximal stenosis, aneurysm vascular malformation. No large or medium vessel occlusion is identified. Primary anterior circulation supply to both posterior cerebral arteries. Posterior circulation: Both vertebral arteries are patent through the foramen magnum to the basilar. Basilar artery is small but does not show focal stenosis. Both superior cerebellar arteries show flow. As noted above, both posterior cerebral arteries receive the majority of there supply from the anterior circulation. Venous sinuses: Patent and normal. Anatomic variants: None significant otherwise. Review of the MIP images confirms the above findings CT Brain Perfusion Findings: ASPECTS: 10 CBF (<30%) Volume: 65mL Perfusion (Tmax>6.0s) volume: 28mL Mismatch Volume: 65mL Infarction Location:None present IMPRESSION: Normal CT perfusion study. Atherosclerotic change at both carotid bifurcations but no  stenosis. Atherosclerotic change in the carotid siphon regions without stenosis greater than 50%. No intracranial large or medium vessel occlusion or correctable proximal stenosis. Aortic atherosclerosis. These results were communicated to Dr. Rory Percy at 12:59 pmon 12/3/2020by text page via the [facility] messaging system. Electronically Signed   By: Nelson Chimes M.D.   On: 03/21/2019 13:01   DG Chest Portable 1 View  Result Date: 03/21/2019 CLINICAL DATA:  Shortness of breath. EXAM: PORTABLE CHEST 1 VIEW COMPARISON:  March 17, 2019. FINDINGS: Stable cardiomediastinal silhouette. No pneumothorax or pleural effusion is noted. Both lungs are clear. The visualized skeletal structures are unremarkable. IMPRESSION: No active disease. Electronically Signed   By: Marijo Conception M.D.   On: 03/21/2019 19:06   DG Swallowing Func-Speech Pathology  Result Date: 03/22/2019 Objective Swallowing Evaluation: Type of Study: MBS-Modified Barium Swallow Study  Patient Details Name: Carlitos Mentel MRN: [id number] Date of Birth: August 14, 1940 Today's Date: 03/22/2019 Time: SLP Start Time (ACUTE ONLY): 1145 -SLP Stop Time (ACUTE ONLY): 1215 SLP Time Calculation (min) (ACUTE ONLY): 30 min Past Medical History: Past Medical History: Diagnosis Date . Cancer ([place])  . Chronic respiratory failure with hypoxia, on home O2 therapy ([place])  . COPD (chronic obstructive pulmonary disease) ([place])  . Emphysema (subcutaneous) (surgical) resulting from a procedure  . Essential hypertension  . GERD (gastroesophageal reflux disease)  . Hyperglycemia  . Hyperlipidemia  . Multiple lung nodules on CT  . Obstructive sleep apnea on CPAP  . Oxygen dependent  . Renal insufficiency bilateral  . Sensorineural hearing loss  HPI: Pt admitted with AMS, aphasia. MRI showed Multiple small acute infarcts, most pronounced  in the bilateral cerebellum but also punctate foci in the bilateral basal ganglia, right parietal lobe. No associated hemorrhage or mass effect.  Pt also with  acute on chronic respiratory failure with hypoxia and hypercapnia, COPD without acute exacerbation:   Patient supposed to be on CPAP at night but was not wearing it.  He was just admitted at [facility] for respiratory failure from 11/29-12/1.  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  No data recorded Assessment / Plan / Recommendation CHL IP CLINICAL IMPRESSIONS 03/22/2019 Clinical Impression Pt demonstrates a severe oropharyngeal dysphagia characterized by decreased mobility of the pharyngeal musculature for airway protection and pharyngeal peristalsis. Pt has very limited epiglottic deflection and reduced closure of the laryngeal vestibule during the swallow. There is also very poor peristalsis in the lower pharyngeal constrictors. As a result, pt silently aspirates during and after the swallow with all liquid textures, regardless of bolus size. Attempted positinoal strategies, efforful swallows and protective coughing. Head turns left and right decrease residue. Hard cough and effortful swallow with honey thick liquid swallows also mostly eject aspirate/penetrate. Pt however has poor comprehension of the situation despite teaching and visual feedback. His ability to follow compensatory strategies to safely consume PO without exetensive teaching would be poor. Recommend short term means of nutrition with intensive acute therapeutic interventions for compensatory strategies and education. Pt can likely transition into a soft solid and honey thick diet prior to d/c with interventions. F?u therapy for strengtheing would also be beneficial. Strongly suspect dysphagia is a result of pt history of radiation to the head and neck combined with deconditioning. Discussed with MD.  SLP Visit Diagnosis Dysphagia, oropharyngeal phase  (R13.12) Attention and concentration deficit following -- Frontal lobe and executive function deficit following -- Impact on safety and function Severe aspiration risk   CHL IP TREATMENT RECOMMENDATION 03/22/2019 Treatment Recommendations Defer until completion of intrumental exam   No flowsheet data found. CHL IP DIET RECOMMENDATION 03/22/2019 SLP Diet Recommendations NPO;Alternative means - temporary Liquid Administration via -- Medication Administration -- Compensations -- Postural Changes --   CHL IP OTHER RECOMMENDATIONS 03/22/2019 Recommended Consults -- Oral Care Recommendations Oral care QID Other Recommendations Have oral suction available   CHL IP FOLLOW UP RECOMMENDATIONS 03/22/2019 Follow up Recommendations Skilled Nursing facility   [facility] IP FREQUENCY AND DURATION 03/22/2019 Speech Therapy Frequency (ACUTE ONLY) min 2x/week Treatment Duration 2 weeks      CHL IP ORAL PHASE 03/22/2019 Oral Phase WFL Oral - Pudding Teaspoon -- Oral - Pudding Cup -- Oral - Honey Teaspoon -- Oral - Honey Cup -- Oral - Nectar Teaspoon -- Oral - Nectar Cup -- Oral - Nectar Straw -- Oral - Thin Teaspoon -- Oral - Thin Cup -- Oral - Thin Straw -- Oral - Puree -- Oral - Mech Soft -- Oral - Regular -- Oral - Multi-Consistency -- Oral - Pill -- Oral Phase - Comment --  CHL IP PHARYNGEAL PHASE 03/22/2019 Pharyngeal Phase Impaired Pharyngeal- Pudding Teaspoon -- Pharyngeal -- Pharyngeal- Pudding Cup -- Pharyngeal -- Pharyngeal- Honey Teaspoon Delayed swallow initiation-vallecula;Reduced epiglottic inversion;Reduced anterior laryngeal mobility;Reduced airway/laryngeal closure;Pharyngeal residue - valleculae;Pharyngeal residue - pyriform;Penetration/Aspiration during swallow;Penetration/Apiration after swallow;Moderate aspiration Pharyngeal Material enters airway, passes BELOW cords without attempt by patient to eject out (silent aspiration);Material enters airway, CONTACTS cords and not ejected out Pharyngeal-  Honey Cup -- Pharyngeal --  Pharyngeal- Nectar Teaspoon Delayed swallow initiation-vallecula;Reduced epiglottic inversion;Reduced anterior laryngeal mobility;Reduced airway/laryngeal closure;Pharyngeal residue - valleculae;Pharyngeal residue - pyriform;Penetration/Aspiration during swallow;Penetration/Apiration after swallow;Moderate aspiration Pharyngeal Material enters airway, passes BELOW cords without attempt by patient to eject out (silent aspiration);Material enters airway, CONTACTS cords and not ejected out Pharyngeal- Nectar Cup -- Pharyngeal -- Pharyngeal- Nectar Straw Delayed swallow initiation-vallecula;Reduced epiglottic inversion;Reduced anterior laryngeal mobility;Reduced airway/laryngeal closure;Pharyngeal residue - valleculae;Pharyngeal residue - pyriform;Penetration/Aspiration during swallow;Penetration/Apiration after swallow;Moderate aspiration Pharyngeal Material enters airway, passes BELOW cords without attempt by patient to eject out (silent aspiration);Material enters airway, CONTACTS cords and not ejected out Pharyngeal- Thin Teaspoon -- Pharyngeal -- Pharyngeal- Thin Cup Delayed swallow initiation-vallecula;Reduced epiglottic inversion;Reduced anterior laryngeal mobility;Reduced airway/laryngeal closure;Pharyngeal residue - valleculae;Pharyngeal residue - pyriform;Penetration/Aspiration during swallow;Penetration/Apiration after swallow;Moderate aspiration Pharyngeal -- Pharyngeal- Thin Straw -- Pharyngeal -- Pharyngeal- Puree Delayed swallow initiation-vallecula;Reduced epiglottic inversion;Reduced anterior laryngeal mobility;Reduced airway/laryngeal closure;Pharyngeal residue - valleculae;Pharyngeal residue - pyriform Pharyngeal -- Pharyngeal- Mechanical Soft -- Pharyngeal -- Pharyngeal- Regular -- Pharyngeal -- Pharyngeal- Multi-consistency -- Pharyngeal -- Pharyngeal- Pill -- Pharyngeal -- Pharyngeal Comment --  No flowsheet data found. [name], MA CCC-SLP Acute Rehabilitation Services Pager [phone]  Office [phone] [name] 03/22/2019, 12:48 PM              ECHOCARDIOGRAM COMPLETE  Result Date: 03/22/2019   ECHOCARDIOGRAM REPORT   Patient Name:   NAMISH FENDT Date of Exam: 03/22/2019 Medical Rec #:  [id number]   Height:       72.0 in Accession #:    [id number]  Weight:       222.4 lb Date of Birth:  May 27, 1940    BSA:          2.23 m Patient Age:    38 years    BP:           158/77 mmHg Patient Gender: M           HR:           95 bpm. Exam Location:  Inpatient Procedure: 2D Echo Indications:    stroke 434.91  History:        Patient has no prior history of Echocardiogram examinations.                 COPD; Risk Factors:Sleep Apnea.  Sonographer:    Johny Chess Referring Phys: [id number] [place] XU IMPRESSIONS  1. Left ventricular ejection fraction, by visual estimation, is 60 to 65%. The left ventricle has normal function. There is no left ventricular hypertrophy.  2. Global right ventricle has mildly reduced systolic function.The right ventricular size is mildly enlarged. No increase in right ventricular wall thickness.  3. Left atrial size was normal.  4. Right atrial size was normal.  5. The mitral valve is grossly normal. Mild mitral valve regurgitation.  6. The tricuspid valve is grossly normal. Tricuspid valve regurgitation is mild.  7. The aortic valve has an indeterminant number of cusps. Aortic valve regurgitation is trivial.  8. The pulmonic valve was grossly normal. Pulmonic valve regurgitation is not visualized.  9. Moderately elevated pulmonary artery systolic pressure. 10. The tricuspid regurgitant velocity is 3.62 m/s, and with an assumed right atrial pressure of 3 mmHg, the estimated right ventricular systolic pressure is moderately elevated at 55.4 mmHg. 11. The atrial septum is grossly normal. FINDINGS  Left Ventricle: Left ventricular ejection fraction, by visual estimation, is 60 to 65%. The left ventricle has normal function. There is no left ventricular  hypertrophy.  Left ventricular diastolic parameters were normal. Right Ventricle: The right ventricular size is mildly enlarged. No increase in right ventricular wall thickness. Global RV systolic function is has mildly reduced systolic function. The tricuspid regurgitant velocity is 3.62 m/s, and with an assumed right atrial pressure of 3 mmHg, the estimated right ventricular systolic pressure is moderately elevated at 55.4 mmHg. Left Atrium: Left atrial size was normal in size. Right Atrium: Right atrial size was normal in size Pericardium: There is no evidence of pericardial effusion. Mitral Valve: The mitral valve is grossly normal. Mild mitral valve regurgitation. Tricuspid Valve: The tricuspid valve is grossly normal. Tricuspid valve regurgitation is mild. Aortic Valve: The aortic valve has an indeterminant number of cusps. . There is moderate thickening and moderate calcification of the aortic valve. Aortic valve regurgitation is trivial. There is moderate thickening of the aortic valve. There is moderate  calcification of the aortic valve. Pulmonic Valve: The pulmonic valve was grossly normal. Pulmonic valve regurgitation is not visualized. Aorta: The aortic root is normal in size and structure. IAS/Shunts: The atrial septum is grossly normal.  LEFT VENTRICLE PLAX 2D LVIDd:         4.40 cm  Diastology LVIDs:         2.70 cm  LV e' lateral:   10.40 cm/s LV PW:         1.00 cm  LV E/e' lateral: 5.9 LV IVS:        1.00 cm  LV e' medial:    8.05 cm/s LVOT diam:     2.00 cm  LV E/e' medial:  7.6 LV SV:         61 ml LV SV Index:   26.52 LVOT Area:     3.14 cm  RIGHT VENTRICLE RV S prime:     9.79 cm/s TAPSE (M-mode): 1.6 cm LEFT ATRIUM         Index      RIGHT ATRIUM           Index LA diam:    2.70 cm 1.21 cm/m RA Area:     16.50 cm                                RA Volume:   44.20 ml  19.83 ml/m  AORTIC VALVE LVOT Vmax:   88.80 cm/s LVOT Vmean:  58.200 cm/s LVOT VTI:    0.159 m  AORTA Ao Root diam: 3.00 cm MITRAL VALVE                         TRICUSPID VALVE MV Area (PHT): 4.31 cm             TR Peak grad:   52.4 mmHg MV PHT:        51.04 msec           TR Vmax:        362.00 cm/s MV Decel Time: 176 msec MV E velocity: 61.30 cm/s 103 cm/s  SHUNTS MV A velocity: 84.00 cm/s 70.3 cm/s Systemic VTI:  0.16 m MV E/A ratio:  0.73       1.5       Systemic Diam: 2.00 cm  Mertie Moores MD Electronically signed by Mertie Moores MD Signature Date/Time: 03/22/2019/4:51:01 PM    Final    VAS [place] LOWER EXTREMITY VENOUS (DVT)  Result Date: 03/25/2019  Lower Venous  Study Indications: Stroke.  Comparison Study: no prior Performing Technologist: Abram Sander RVS  Examination Guidelines: A complete evaluation includes B-mode imaging, spectral Doppler, color Doppler, and power Doppler as needed of all accessible portions of each vessel. Bilateral testing is considered an integral part of a complete examination. Limited examinations for reoccurring indications may be performed as noted.  +---------+---------------+---------+-----------+----------+--------------+ RIGHT    CompressibilityPhasicitySpontaneityPropertiesThrombus Aging +---------+---------------+---------+-----------+----------+--------------+ CFV      Full           Yes      Yes                                 +---------+---------------+---------+-----------+----------+--------------+ SFJ      Full                                                        +---------+---------------+---------+-----------+----------+--------------+ FV Prox  Full                                                        +---------+---------------+---------+-----------+----------+--------------+ FV Mid   Full                                                        +---------+---------------+---------+-----------+----------+--------------+ FV DistalFull                                                         +---------+---------------+---------+-----------+----------+--------------+ PFV      Full                                                        +---------+---------------+---------+-----------+----------+--------------+ POP      Full           Yes      Yes                                 +---------+---------------+---------+-----------+----------+--------------+ PTV      Full                                                        +---------+---------------+---------+-----------+----------+--------------+ PERO     Full                                                        +---------+---------------+---------+-----------+----------+--------------+   +---------+---------------+---------+-----------+----------+--------------+  LEFT     CompressibilityPhasicitySpontaneityPropertiesThrombus Aging +---------+---------------+---------+-----------+----------+--------------+ CFV      Full           Yes      Yes                                 +---------+---------------+---------+-----------+----------+--------------+ SFJ      Full                                                        +---------+---------------+---------+-----------+----------+--------------+ FV Prox  Full                                                        +---------+---------------+---------+-----------+----------+--------------+ FV Mid   Full                                                        +---------+---------------+---------+-----------+----------+--------------+ FV DistalFull                                                        +---------+---------------+---------+-----------+----------+--------------+ PFV      Full                                                        +---------+---------------+---------+-----------+----------+--------------+ POP      Full           Yes      Yes                                  +---------+---------------+---------+-----------+----------+--------------+ PTV      Full                                                        +---------+---------------+---------+-----------+----------+--------------+ PERO     Full                                                        +---------+---------------+---------+-----------+----------+--------------+     Summary: Right: There is no evidence of deep vein thrombosis in the lower extremity. No cystic structure found in the popliteal fossa. Left: There is no evidence of deep vein thrombosis in the lower extremity. No cystic structure found in the popliteal fossa.  *  See table(s) above for measurements and observations. Electronically signed by Servando Snare MD on 03/25/2019 at 5:04:25 PM.    Final    (Echo, Carotid, EGD, Colonoscopy, ERCP)    Subjective: Doing well, on home o2, having BM, feels ready for d/c today  Discharge Exam: Vitals:   03/29/19 0744 03/29/19 0752  BP: (!) 166/83 (!) 166/83  Pulse: 91 96  Resp: 18 16  Temp: 98.7 F (37.1 C)   SpO2: 100% 100%   Vitals:   03/29/19 0023 03/29/19 0529 03/29/19 0744 03/29/19 0752  BP: (!) 108/58 126/69 (!) 166/83 (!) 166/83  Pulse: 93 88 91 96  Resp: 18 18 18 16   Temp: 98.6 F (37 C) 98.4 F (36.9 C) 98.7 F (37.1 C)   TempSrc: Oral Oral Oral   SpO2: 99% 94% 100% 100%  Weight:  99.1 kg    Height:        General: Pt is alert, awake, not in acute distress Cardiovascular: RRR, S1/S2 +, no rubs, no gallops Respiratory: CTA bilaterally, no wheezing, no rhonchi Abdominal: Soft, NT, ND, bowel sounds + Extremities: no edema, no cyanosis   The results of significant diagnostics from this hospitalization (including imaging, microbiology, ancillary and laboratory) are listed below for reference.     Microbiology: Recent Results (from the past 240 hour(s))  Culture, blood (routine x 2)     Status: None   Collection Time: 03/21/19  3:16 PM   Specimen: BLOOD RIGHT  HAND  Result Value Ref Range Status   Specimen Description BLOOD RIGHT HAND  Final   Special Requests   Final    BOTTLES DRAWN AEROBIC AND ANAEROBIC Blood Culture results may not be optimal due to an inadequate volume of blood received in culture bottles   Culture   Final    NO GROWTH 5 DAYS Performed at [facility], [place] [address] [place], [place] [address]    Report Status 03/26/2019 FINAL  Final  SARS Coronavirus 2 by RT PCR (hospital order, performed in [facility] hospital lab) Nasopharyngeal Nasopharyngeal Swab     Status: None   Collection Time: 03/21/19  3:24 PM   Specimen: Nasopharyngeal Swab  Result Value Ref Range Status   SARS Coronavirus 2 NEGATIVE NEGATIVE Final    Comment: (NOTE) SARS-CoV-2 target nucleic acids are NOT DETECTED. The SARS-CoV-2 RNA is generally detectable in upper and lower respiratory specimens during the acute phase of infection. The lowest concentration of SARS-CoV-2 viral copies this assay can detect is 250 copies / mL. A negative result does not preclude SARS-CoV-2 infection and should not be used as the sole basis for treatment or other patient management decisions.  A negative result may occur with improper specimen collection / handling, submission of specimen other than nasopharyngeal swab, presence of viral mutation(s) within the areas targeted by this assay, and inadequate number of viral copies (<250 copies / mL). A negative result must be combined with clinical observations, patient history, and epidemiological information. Fact Sheet for Patients:   StrictlyIdeas.no Fact Sheet for Healthcare Providers: BankingDealers.co.za This test is not yet approved or cleared  by the [place] FDA and has been authorized for detection and/or diagnosis of SARS-CoV-2 by FDA under an Emergency Use Authorization (EUA).  This EUA will remain in effect (meaning this test can be used) for the  duration of the COVID-19 declaration under Section 564(b)(1) of the Act, 21 U.S.C. section 360bbb-3(b)(1), unless the authorization is terminated or revoked sooner. Performed at [facility], [place] [address] [place], [place] [address]   Culture, blood (routine x 2)     Status: None   Collection Time: 03/21/19  4:05 PM   Specimen: BLOOD LEFT WRIST  Result Value Ref Range Status   Specimen Description BLOOD LEFT WRIST  Final   Special Requests   Final    BOTTLES DRAWN AEROBIC AND ANAEROBIC Blood Culture results may not be optimal due to an inadequate volume of blood received in culture bottles   Culture   Final    NO GROWTH 5 DAYS Performed at [facility], [place] [address] [place], [place] [address]    Report Status 03/26/2019 FINAL  Final  Respiratory Panel by PCR     Status: None   Collection Time: 03/21/19 10:01 PM   Specimen: Nasopharyngeal Swab; Respiratory  Result Value Ref Range Status   Adenovirus NOT DETECTED NOT DETECTED Final   Coronavirus 229E NOT DETECTED NOT DETECTED Final    Comment: (NOTE) The Coronavirus on the Respiratory Panel, DOES NOT test for the novel  Coronavirus (2019 nCoV)    Coronavirus HKU1 NOT DETECTED NOT DETECTED Final   Coronavirus NL63 NOT DETECTED NOT DETECTED Final   Coronavirus OC43 NOT DETECTED NOT DETECTED Final   Metapneumovirus NOT DETECTED NOT DETECTED Final   Rhinovirus / Enterovirus NOT DETECTED NOT DETECTED Final   Influenza A NOT DETECTED NOT DETECTED Final   Influenza B NOT DETECTED NOT DETECTED Final   Parainfluenza Virus 1 NOT DETECTED NOT DETECTED Final   Parainfluenza Virus 2 NOT DETECTED NOT DETECTED Final   Parainfluenza Virus 3 NOT DETECTED NOT DETECTED Final   Parainfluenza Virus 4 NOT DETECTED NOT DETECTED Final   Respiratory Syncytial Virus NOT DETECTED NOT DETECTED Final   Bordetella pertussis NOT DETECTED NOT DETECTED Final   Chlamydophila pneumoniae NOT DETECTED NOT DETECTED Final   Mycoplasma pneumoniae  NOT DETECTED NOT DETECTED Final    Comment: Performed at [facility], [place] [address], [place], [place] [address]  SARS CORONAVIRUS 2 (TAT 6-24 HRS) Nasopharyngeal Nasopharyngeal Swab     Status: None   Collection Time: 03/26/19  1:04 PM   Specimen: Nasopharyngeal Swab  Result Value Ref Range Status   SARS Coronavirus 2 NEGATIVE NEGATIVE Final    Comment: (NOTE) SARS-CoV-2 target nucleic acids are NOT DETECTED. The SARS-CoV-2 RNA is generally detectable in upper and lower respiratory specimens during the acute phase of infection. Negative results do not preclude SARS-CoV-2 infection, do not rule out co-infections with other pathogens, and should not be used as the sole basis for treatment or other patient management decisions. Negative results must be combined with clinical observations, patient history, and epidemiological information. The expected result is Negative. Fact Sheet for Patients: SugarRoll.be Fact Sheet for Healthcare Providers: https://www.woods-mathews.com/ This test is not yet approved or cleared by the [place] FDA and  has been authorized for detection and/or diagnosis of SARS-CoV-2 by FDA under an Emergency Use Authorization (EUA). This EUA will remain  in effect (meaning this test can be used) for the duration of the COVID-19 declaration under Section 56 4(b)(1) of the Act, 21 U.S.C. section 360bbb-3(b)(1), unless the authorization is terminated or revoked sooner. Performed at [facility], [place] [address], [place], [place] [address]      Labs: BNP (last 3 results) No results for input(s): BNP in the last 8760 hours. Basic Metabolic Panel: Recent Labs  Lab 03/26/19 0436 03/27/19 [id number] 03/28/19 0334 03/28/19 1621 03/29/19 0357  NA 140 137 138 138 140  K 5.3* 5.5* 5.0 4.8 5.1  CL 94* 92* 94* 96* 98  CO2 36* 34* 34* 33* 34*  GLUCOSE 100* 142* 114* 139* 145*  BUN 37* 33* 38* 39* 37*  CREATININE  1.38* 1.40* 1.67* 1.66* 1.42*  CALCIUM 9.1 9.4 9.0 8.7* 8.8*  PHOS 3.9  --   --   --   --    Liver Function Tests: Recent Labs  Lab 03/24/19 0501 03/26/19 0436 03/27/19 0959 03/28/19 0334 03/29/19 0357  AST 327* 98* 56* 36 29  ALT 621* 314* 223* 167* 126*  ALKPHOS 86 78 77 67 71  BILITOT 0.3 0.9 0.7 0.5 0.6  PROT 7.0 7.0 7.4 6.8 6.9  ALBUMIN 2.6* 2.5* 2.7* 2.7* 2.7*   No results for input(s): LIPASE, AMYLASE in the last 168 hours. No results for input(s): AMMONIA in the last 168 hours. CBC: Recent Labs  Lab 03/23/19 0433 03/26/19 0436  WBC 7.5 7.5  NEUTROABS 5.6 5.0  HGB 11.3* 11.8*  HCT 38.1* 40.2  MCV 90.3 91.0  PLT 237 270   Cardiac Enzymes: No results for input(s): CKTOTAL, CKMB, CKMBINDEX, TROPONINI in the last 168 hours. BNP: Invalid input(s): POCBNP CBG: Recent Labs  Lab 03/26/19 0010 03/26/19 0402 03/26/19 0724 03/26/19 1112 03/26/19 1611  GLUCAP 117* 90 85 180* 134*   D-Dimer No results for input(s): DDIMER in the last 72 hours. Hgb A1c No results for input(s): HGBA1C in the last 72 hours. Lipid Profile No results for input(s): CHOL, HDL, LDLCALC, TRIG, CHOLHDL, LDLDIRECT in the last 72 hours. Thyroid function studies No results for input(s): TSH, T4TOTAL, T3FREE, THYROIDAB in the last 72 hours.  Invalid input(s): FREET3 Anemia work up No results for input(s): VITAMINB12, FOLATE, FERRITIN, TIBC, IRON, RETICCTPCT in the last 72 hours. Urinalysis    Component Value Date/Time   COLORURINE YELLOW 03/21/2019 1703   APPEARANCEUR HAZY (A) 03/21/2019 1703   LABSPEC >1.046 (H) 03/21/2019 1703   PHURINE 5.0 03/21/2019 1703   GLUCOSEU 50 (A) 03/21/2019 1703   HGBUR SMALL (A) 03/21/2019 1703   BILIRUBINUR NEGATIVE 03/21/2019 1703   KETONESUR NEGATIVE 03/21/2019 1703   PROTEINUR 30 (A) 03/21/2019 1703   NITRITE NEGATIVE 03/21/2019 1703   LEUKOCYTESUR NEGATIVE 03/21/2019 1703   Sepsis Labs Invalid input(s): PROCALCITONIN,  WBC,   LACTICIDVEN Microbiology Recent Results (from the past 240 hour(s))  Culture, blood (routine x 2)     Status: None   Collection Time: 03/21/19  3:16 PM   Specimen: BLOOD RIGHT HAND  Result Value Ref Range Status   Specimen Description BLOOD RIGHT HAND  Final   Special Requests   Final    BOTTLES DRAWN AEROBIC AND ANAEROBIC Blood Culture results may not be optimal due to an inadequate volume of blood received in culture bottles   Culture   Final    NO GROWTH 5 DAYS Performed at [facility], [place] [address] [place], [place] [address]    Report Status 03/26/2019 FINAL  Final  SARS Coronavirus 2 by RT PCR (hospital order, performed in [facility] hospital lab) Nasopharyngeal Nasopharyngeal Swab     Status: None   Collection Time: 03/21/19  3:24 PM   Specimen: Nasopharyngeal Swab  Result Value Ref Range Status   SARS Coronavirus 2 NEGATIVE NEGATIVE Final    Comment: (NOTE) SARS-CoV-2 target nucleic acids are NOT DETECTED. The SARS-CoV-2 RNA is generally detectable in upper and lower respiratory specimens during the acute phase of infection. The  lowest concentration of SARS-CoV-2 viral copies this assay can detect is 250 copies / mL. A negative result does not preclude SARS-CoV-2 infection and should not be used as the sole basis for treatment or other patient management decisions.  A negative result may occur with improper specimen collection / handling, submission of specimen other than nasopharyngeal swab, presence of viral mutation(s) within the areas targeted by this assay, and inadequate number of viral copies (<250 copies / mL). A negative result must be combined with clinical observations, patient history, and epidemiological information. Fact Sheet for Patients:   StrictlyIdeas.no Fact Sheet for Healthcare Providers: BankingDealers.co.za This test is not yet approved or cleared  by the [place] FDA and has been  authorized for detection and/or diagnosis of SARS-CoV-2 by FDA under an Emergency Use Authorization (EUA).  This EUA will remain in effect (meaning this test can be used) for the duration of the COVID-19 declaration under Section 564(b)(1) of the Act, 21 U.S.C. section 360bbb-3(b)(1), unless the authorization is terminated or revoked sooner. Performed at [facility], [place] [address], [place], [place] [address]   Culture, blood (routine x 2)     Status: None   Collection Time: 03/21/19  4:05 PM   Specimen: BLOOD LEFT WRIST  Result Value Ref Range Status   Specimen Description BLOOD LEFT WRIST  Final   Special Requests   Final    BOTTLES DRAWN AEROBIC AND ANAEROBIC Blood Culture results may not be optimal due to an inadequate volume of blood received in culture bottles   Culture   Final    NO GROWTH 5 DAYS Performed at [facility], [place] [address] [place], [place] [address]    Report Status 03/26/2019 FINAL  Final  Respiratory Panel by PCR     Status: None   Collection Time: 03/21/19 10:01 PM   Specimen: Nasopharyngeal Swab; Respiratory  Result Value Ref Range Status   Adenovirus NOT DETECTED NOT DETECTED Final   Coronavirus 229E NOT DETECTED NOT DETECTED Final    Comment: (NOTE) The Coronavirus on the Respiratory Panel, DOES NOT test for the novel  Coronavirus (2019 nCoV)    Coronavirus HKU1 NOT DETECTED NOT DETECTED Final   Coronavirus NL63 NOT DETECTED NOT DETECTED Final   Coronavirus OC43 NOT DETECTED NOT DETECTED Final   Metapneumovirus NOT DETECTED NOT DETECTED Final   Rhinovirus / Enterovirus NOT DETECTED NOT DETECTED Final   Influenza A NOT DETECTED NOT DETECTED Final   Influenza B NOT DETECTED NOT DETECTED Final   Parainfluenza Virus 1 NOT DETECTED NOT DETECTED Final   Parainfluenza Virus 2 NOT DETECTED NOT DETECTED Final   Parainfluenza Virus 3 NOT DETECTED NOT DETECTED Final   Parainfluenza Virus 4 NOT DETECTED NOT DETECTED Final   Respiratory Syncytial  Virus NOT DETECTED NOT DETECTED Final   Bordetella pertussis NOT DETECTED NOT DETECTED Final   Chlamydophila pneumoniae NOT DETECTED NOT DETECTED Final   Mycoplasma pneumoniae NOT DETECTED NOT DETECTED Final    Comment: Performed at [facility], [place] [address] [place], [place] [address]  SARS CORONAVIRUS 2 (TAT 6-24 HRS) Nasopharyngeal Nasopharyngeal Swab     Status: None   Collection Time: 03/26/19  1:04 PM   Specimen: Nasopharyngeal Swab  Result Value Ref Range Status   SARS Coronavirus 2 NEGATIVE NEGATIVE Final    Comment: (NOTE) SARS-CoV-2 target nucleic acids are NOT DETECTED. The SARS-CoV-2 RNA is generally detectable in upper and lower respiratory specimens during the acute phase of infection.  Negative results do not preclude SARS-CoV-2 infection, do not rule out co-infections with other pathogens, and should not be used as the sole basis for treatment or other patient management decisions. Negative results must be combined with clinical observations, patient history, and epidemiological information. The expected result is Negative. Fact Sheet for Patients: SugarRoll.be Fact Sheet for Healthcare Providers: https://www.woods-mathews.com/ This test is not yet approved or cleared by the [place] FDA and  has been authorized for detection and/or diagnosis of SARS-CoV-2 by FDA under an Emergency Use Authorization (EUA). This EUA will remain  in effect (meaning this test can be used) for the duration of the COVID-19 declaration under Section 56 4(b)(1) of the Act, 21 U.S.C. section 360bbb-3(b)(1), unless the authorization is terminated or revoked sooner. Performed at [facility], [place] [address] [place], [place] [address]      Time coordinating discharge: 35 minutes  SIGNED:   Antonieta Pert, MD  [facility] 03/29/2019, 8:57 AM  If 7PM-7AM, please contact night-coverage [email or web address]

## 2019-04-09 ENCOUNTER — Ambulatory Visit: Payer: Medicare HMO

## 2019-04-15 ENCOUNTER — Encounter (HOSPITAL_COMMUNITY): Payer: Self-pay | Admitting: Emergency Medicine

## 2019-04-15 ENCOUNTER — Inpatient Hospital Stay (HOSPITAL_COMMUNITY)
Admission: EM | Admit: 2019-04-15 | Discharge: 2019-04-19 | DRG: 871 | Disposition: E | Payer: Medicare HMO | Attending: Internal Medicine | Admitting: Internal Medicine

## 2019-04-15 ENCOUNTER — Emergency Department (HOSPITAL_COMMUNITY): Payer: Medicare HMO

## 2019-04-15 ENCOUNTER — Other Ambulatory Visit: Payer: Self-pay

## 2019-04-15 DIAGNOSIS — G4733 Obstructive sleep apnea (adult) (pediatric): Secondary | ICD-10-CM | POA: Diagnosis present

## 2019-04-15 DIAGNOSIS — Z7902 Long term (current) use of antithrombotics/antiplatelets: Secondary | ICD-10-CM | POA: Diagnosis not present

## 2019-04-15 DIAGNOSIS — E785 Hyperlipidemia, unspecified: Secondary | ICD-10-CM | POA: Diagnosis present

## 2019-04-15 DIAGNOSIS — A4189 Other specified sepsis: Secondary | ICD-10-CM | POA: Diagnosis present

## 2019-04-15 DIAGNOSIS — Z7982 Long term (current) use of aspirin: Secondary | ICD-10-CM

## 2019-04-15 DIAGNOSIS — Z9981 Dependence on supplemental oxygen: Secondary | ICD-10-CM | POA: Diagnosis not present

## 2019-04-15 DIAGNOSIS — J9621 Acute and chronic respiratory failure with hypoxia: Secondary | ICD-10-CM | POA: Diagnosis present

## 2019-04-15 DIAGNOSIS — K219 Gastro-esophageal reflux disease without esophagitis: Secondary | ICD-10-CM | POA: Diagnosis present

## 2019-04-15 DIAGNOSIS — I10 Essential (primary) hypertension: Secondary | ICD-10-CM | POA: Diagnosis present

## 2019-04-15 DIAGNOSIS — J449 Chronic obstructive pulmonary disease, unspecified: Secondary | ICD-10-CM | POA: Diagnosis present

## 2019-04-15 DIAGNOSIS — U071 COVID-19: Secondary | ICD-10-CM

## 2019-04-15 DIAGNOSIS — R7989 Other specified abnormal findings of blood chemistry: Secondary | ICD-10-CM

## 2019-04-15 DIAGNOSIS — A419 Sepsis, unspecified organism: Secondary | ICD-10-CM

## 2019-04-15 DIAGNOSIS — N289 Disorder of kidney and ureter, unspecified: Secondary | ICD-10-CM | POA: Diagnosis present

## 2019-04-15 DIAGNOSIS — J9601 Acute respiratory failure with hypoxia: Secondary | ICD-10-CM

## 2019-04-15 DIAGNOSIS — N179 Acute kidney failure, unspecified: Secondary | ICD-10-CM | POA: Diagnosis present

## 2019-04-15 DIAGNOSIS — Z87891 Personal history of nicotine dependence: Secondary | ICD-10-CM | POA: Diagnosis not present

## 2019-04-15 DIAGNOSIS — Z66 Do not resuscitate: Secondary | ICD-10-CM | POA: Diagnosis present

## 2019-04-15 DIAGNOSIS — Z8249 Family history of ischemic heart disease and other diseases of the circulatory system: Secondary | ICD-10-CM

## 2019-04-15 DIAGNOSIS — Z79899 Other long term (current) drug therapy: Secondary | ICD-10-CM | POA: Diagnosis not present

## 2019-04-15 DIAGNOSIS — J1289 Other viral pneumonia: Secondary | ICD-10-CM | POA: Diagnosis present

## 2019-04-15 DIAGNOSIS — Z8673 Personal history of transient ischemic attack (TIA), and cerebral infarction without residual deficits: Secondary | ICD-10-CM | POA: Diagnosis not present

## 2019-04-15 DIAGNOSIS — I6340 Cerebral infarction due to embolism of unspecified cerebral artery: Secondary | ICD-10-CM | POA: Diagnosis present

## 2019-04-15 DIAGNOSIS — Z7951 Long term (current) use of inhaled steroids: Secondary | ICD-10-CM | POA: Diagnosis not present

## 2019-04-15 LAB — CBC WITH DIFFERENTIAL/PLATELET
Abs Immature Granulocytes: 0.08 10*3/uL — ABNORMAL HIGH (ref 0.00–0.07)
Basophils Absolute: 0 10*3/uL (ref 0.0–0.1)
Basophils Relative: 0 %
Eosinophils Absolute: 0 10*3/uL (ref 0.0–0.5)
Eosinophils Relative: 0 %
HCT: 48.5 % (ref 39.0–52.0)
Hemoglobin: 14.4 g/dL (ref 13.0–17.0)
Immature Granulocytes: 1 %
Lymphocytes Relative: 13 %
Lymphs Abs: 0.9 10*3/uL (ref 0.7–4.0)
MCH: 26.5 pg (ref 26.0–34.0)
MCHC: 29.7 g/dL — ABNORMAL LOW (ref 30.0–36.0)
MCV: 89.2 fL (ref 80.0–100.0)
Monocytes Absolute: 0.2 10*3/uL (ref 0.1–1.0)
Monocytes Relative: 3 %
Neutro Abs: 5.6 10*3/uL (ref 1.7–7.7)
Neutrophils Relative %: 83 %
Platelets: 185 10*3/uL (ref 150–400)
RBC: 5.44 MIL/uL (ref 4.22–5.81)
RDW: 16.4 % — ABNORMAL HIGH (ref 11.5–15.5)
WBC: 6.7 10*3/uL (ref 4.0–10.5)
nRBC: 0 % (ref 0.0–0.2)

## 2019-04-15 LAB — CBG MONITORING, ED: Glucose-Capillary: 165 mg/dL — ABNORMAL HIGH (ref 70–99)

## 2019-04-15 LAB — PROTIME-INR
INR: 1.2 (ref 0.8–1.2)
Prothrombin Time: 15 seconds (ref 11.4–15.2)

## 2019-04-15 LAB — LACTATE DEHYDROGENASE: LDH: 459 U/L — ABNORMAL HIGH (ref 98–192)

## 2019-04-15 LAB — CREATININE, SERUM
Creatinine, Ser: 4.62 mg/dL — ABNORMAL HIGH (ref 0.61–1.24)
GFR calc Af Amer: 13 mL/min — ABNORMAL LOW (ref >60)
GFR calc non Af Amer: 11 mL/min — ABNORMAL LOW (ref >60)

## 2019-04-15 LAB — COMPREHENSIVE METABOLIC PANEL
ALT: 237 U/L — ABNORMAL HIGH (ref 0–44)
AST: 406 U/L — ABNORMAL HIGH (ref 15–41)
Albumin: 2.9 g/dL — ABNORMAL LOW (ref 3.5–5.0)
Alkaline Phosphatase: 150 U/L — ABNORMAL HIGH (ref 38–126)
Anion gap: 17 — ABNORMAL HIGH (ref 5–15)
BUN: 86 mg/dL — ABNORMAL HIGH (ref 8–23)
CO2: 25 mmol/L (ref 22–32)
Calcium: 9.2 mg/dL (ref 8.9–10.3)
Chloride: 101 mmol/L (ref 98–111)
Creatinine, Ser: 3.84 mg/dL — ABNORMAL HIGH (ref 0.61–1.24)
GFR calc Af Amer: 16 mL/min — ABNORMAL LOW (ref >60)
GFR calc non Af Amer: 14 mL/min — ABNORMAL LOW (ref >60)
Glucose, Bld: 204 mg/dL — ABNORMAL HIGH (ref 70–99)
Potassium: 5.3 mmol/L — ABNORMAL HIGH (ref 3.5–5.1)
Sodium: 143 mmol/L (ref 135–145)
Total Bilirubin: 1.3 mg/dL — ABNORMAL HIGH (ref 0.3–1.2)
Total Protein: 8.6 g/dL — ABNORMAL HIGH (ref 6.5–8.1)

## 2019-04-15 LAB — POCT I-STAT 7, (LYTES, BLD GAS, ICA,H+H)
Bicarbonate: 25.8 mmol/L (ref 20.0–28.0)
Calcium, Ion: 1.21 mmol/L (ref 1.15–1.40)
HCT: 45 % (ref 39.0–52.0)
Hemoglobin: 15.3 g/dL (ref 13.0–17.0)
O2 Saturation: 98 %
Potassium: 4.9 mmol/L (ref 3.5–5.1)
Sodium: 138 mmol/L (ref 135–145)
TCO2: 27 mmol/L (ref 22–32)
pCO2 arterial: 43.2 mmHg (ref 32.0–48.0)
pH, Arterial: 7.384 (ref 7.350–7.450)
pO2, Arterial: 103 mmHg (ref 83.0–108.0)

## 2019-04-15 LAB — LACTIC ACID, PLASMA
Lactic Acid, Venous: 3.6 mmol/L (ref 0.5–1.9)
Lactic Acid, Venous: 5 mmol/L (ref 0.5–1.9)
Lactic Acid, Venous: 6.1 mmol/L (ref 0.5–1.9)

## 2019-04-15 LAB — ABO/RH: ABO/RH(D): AB POS

## 2019-04-15 LAB — POC SARS CORONAVIRUS 2 AG -  ED: SARS Coronavirus 2 Ag: POSITIVE — AB

## 2019-04-15 LAB — D-DIMER, QUANTITATIVE: D-Dimer, Quant: 5.25 ug/mL-FEU — ABNORMAL HIGH (ref 0.00–0.50)

## 2019-04-15 LAB — APTT: aPTT: 70 seconds — ABNORMAL HIGH (ref 24–36)

## 2019-04-15 LAB — FIBRINOGEN: Fibrinogen: 764 mg/dL — ABNORMAL HIGH (ref 210–475)

## 2019-04-15 LAB — C-REACTIVE PROTEIN: CRP: 25.3 mg/dL — ABNORMAL HIGH (ref <1.0)

## 2019-04-15 MED ORDER — SODIUM CHLORIDE 0.9 % IV SOLN: Freq: Once | INTRAVENOUS | Status: AC

## 2019-04-15 MED ORDER — DEXAMETHASONE SODIUM PHOSPHATE 10 MG/ML IJ SOLN
6.0000 mg | INTRAMUSCULAR | Status: DC
  Administered 2019-04-15: 6 mg via INTRAVENOUS
  Filled 2019-04-15: qty 1

## 2019-04-15 MED ORDER — VANCOMYCIN HCL IN DEXTROSE 1-5 GM/200ML-% IV SOLN
1000.0000 mg | Freq: Once | INTRAVENOUS | Status: DC
  Filled 2019-04-15: qty 200

## 2019-04-15 MED ORDER — SODIUM CHLORIDE 0.9 % IV SOLN: INTRAVENOUS | Status: DC

## 2019-04-15 MED ORDER — PROPOFOL 1000 MG/100ML IV EMUL
INTRAVENOUS | Status: AC
  Filled 2019-04-15: qty 100

## 2019-04-15 MED ORDER — SODIUM CHLORIDE 0.9 % IV BOLUS
500.0000 mL | Freq: Once | INTRAVENOUS | Status: AC
  Administered 2019-04-15: 17:00:00 500 mL via INTRAVENOUS

## 2019-04-15 MED ORDER — HEPARIN SODIUM (PORCINE) 5000 UNIT/ML IJ SOLN
5000.0000 [IU] | Freq: Three times a day (TID) | INTRAMUSCULAR | Status: DC
  Administered 2019-04-15: 5000 [IU] via SUBCUTANEOUS
  Filled 2019-04-15: qty 1

## 2019-04-15 MED ORDER — METHYLPREDNISOLONE SODIUM SUCC 125 MG IJ SOLR
125.0000 mg | Freq: Once | INTRAMUSCULAR | Status: DC
  Filled 2019-04-15: qty 2

## 2019-04-15 MED ORDER — IPRATROPIUM-ALBUTEROL 0.5-2.5 (3) MG/3ML IN SOLN
3.0000 mL | Freq: Once | RESPIRATORY_TRACT | Status: DC
  Filled 2019-04-15: qty 3

## 2019-04-15 MED ORDER — SODIUM CHLORIDE 0.9 % IV SOLN
200.0000 mg | Freq: Once | INTRAVENOUS | Status: AC
  Administered 2019-04-15: 200 mg via INTRAVENOUS
  Filled 2019-04-15: qty 40

## 2019-04-15 MED ORDER — SODIUM CHLORIDE 0.9 % IV SOLN: 2.0000 g | INTRAVENOUS | Status: DC

## 2019-04-15 MED ORDER — SODIUM CHLORIDE 0.9 % IV SOLN
2.0000 g | Freq: Once | INTRAVENOUS | Status: AC
  Administered 2019-04-15: 16:00:00 2 g via INTRAVENOUS
  Filled 2019-04-15: qty 2

## 2019-04-15 MED ORDER — MAGNESIUM SULFATE 2 GM/50ML IV SOLN
2.0000 g | Freq: Once | INTRAVENOUS | Status: AC
  Administered 2019-04-15: 2 g via INTRAVENOUS
  Filled 2019-04-15: qty 50

## 2019-04-15 MED ORDER — ACETAMINOPHEN 325 MG RE SUPP: 325.0000 mg | RECTAL | Status: DC | PRN

## 2019-04-15 MED ORDER — SODIUM CHLORIDE 0.9 % IV SOLN
100.0000 mg | INTRAVENOUS | Status: DC
  Filled 2019-04-15: qty 20

## 2019-04-15 MED ORDER — VANCOMYCIN VARIABLE DOSE PER UNSTABLE RENAL FUNCTION (PHARMACIST DOSING): Status: DC

## 2019-04-15 MED ORDER — ONDANSETRON HCL 4 MG PO TABS: 4.0000 mg | ORAL_TABLET | Freq: Four times a day (QID) | ORAL | Status: DC | PRN

## 2019-04-15 MED ORDER — SODIUM CHLORIDE 0.9 % IV BOLUS
1000.0000 mL | Freq: Once | INTRAVENOUS | Status: AC
  Administered 2019-04-15: 17:00:00 1000 mL via INTRAVENOUS

## 2019-04-15 MED ORDER — ONDANSETRON HCL 4 MG/2ML IJ SOLN: 4.0000 mg | Freq: Four times a day (QID) | INTRAMUSCULAR | Status: DC | PRN

## 2019-04-15 MED ORDER — METRONIDAZOLE IN NACL 5-0.79 MG/ML-% IV SOLN
500.0000 mg | Freq: Once | INTRAVENOUS | Status: AC
  Administered 2019-04-15: 500 mg via INTRAVENOUS
  Filled 2019-04-15: qty 100

## 2019-04-15 MED ORDER — VANCOMYCIN HCL 2000 MG/400ML IV SOLN
2000.0000 mg | Freq: Once | INTRAVENOUS | Status: AC
  Administered 2019-04-15: 2000 mg via INTRAVENOUS
  Filled 2019-04-15: qty 400

## 2019-04-15 NOTE — Progress Notes (Signed)
Was able to reach wife and daughter.  Poor medical understanding.  They are not ready for comfort care yet and would like to see how he does overnight before making any decisions.  Eulogio Bear DO

## 2019-04-15 NOTE — ED Notes (Signed)
Provider paged regarding extra IV fluids and redrawing Lactic Acid. Also informed of Bladder Scan results. Awaiting further orders.

## 2019-04-15 NOTE — ED Provider Notes (Signed)
[facility] EMERGENCY DEPARTMENT Provider Note   CSN: [id number] Arrival date & time: 03/28/2019  1352     History Chief Complaint  Patient presents with  . COVID+  . Respiratory Distress    Phillip Owens is a 78 y.o. male.  The history is provided by the EMS personnel, medical records and the nursing home. The history is limited by the condition of the patient. No language interpreter was used.  Shortness of Breath Severity:  Severe Onset quality:  Gradual Duration:  1 day Timing:  Constant Progression:  Unchanged Chronicity:  Recurrent Context: URI   Relieved by:  Nothing Worsened by:  Nothing Associated symptoms: cough, fever and wheezing   Associated symptoms: no abdominal pain and no chest pain   Risk factors: obesity   Risk factors: no hx of PE/DVT    LVL 5 caveat for respiratory distress and AMS     Past Medical History:  Diagnosis Date  . Cancer ([place])   . Chronic respiratory failure with hypoxia, on home O2 therapy ([place])   . COPD (chronic obstructive pulmonary disease) ([place])   . Emphysema (subcutaneous) (surgical) resulting from a procedure   . Essential hypertension   . GERD (gastroesophageal reflux disease)   . Hyperglycemia   . Hyperlipidemia   . Multiple lung nodules on CT   . Obstructive sleep apnea on CPAP   . Oxygen dependent   . Renal insufficiency bilateral   . Sensorineural hearing loss     Patient Active Problem List   Diagnosis Date Noted  . 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 Acute on chronic respiratory failure with hypoxia and hypercapnia ([place]) 03/21/2019  . SIRS (systemic inflammatory response syndrome) ([place]) 03/21/2019  . Acute metabolic encephalopathy [id number]  . Aphasia 03/21/2019  . Falls 03/21/2019  . COPD (chronic obstructive pulmonary disease) ([place]) 03/21/2019  . AKI (acute kidney injury) ([place]) 03/21/2019  . Generalized weakness 03/21/2019  . OSA (obstructive sleep apnea) 03/21/2019      Past Surgical History:  Procedure Laterality Date  . LOOP RECORDER INSERTION N/A 03/26/2019   Procedure: LOOP RECORDER INSERTION;  Surgeon: Thompson Grayer, MD;  Location: [facility];  Service: Cardiovascular;  Laterality: N/A;       Family History  Problem Relation Age of Onset  . Hypertension Mother   . Hypertension Father     Social History   Tobacco Use  . Smoking status: Former Research scientist (life sciences)  . Smokeless tobacco: Current User  Substance Use Topics  . Alcohol use: Not Currently  . Drug use: Never    Home Medications Prior to Admission medications   Medication Sig Start Date End Date Taking? Authorizing Provider  allopurinol (ZYLOPRIM) 300 MG tablet Take 300 mg by mouth daily. 02/05/19   [provider]  amLODipine (NORVASC) 10 MG tablet Take 10 mg by mouth daily. 02/05/19   [provider]  aspirin EC 81 MG tablet Take 81 mg by mouth daily. 01/21/08   [provider]  atorvastatin (LIPITOR) 40 MG tablet Take 1 tablet (40 mg total) by mouth daily at 6 PM. 03/26/19   Nita Sells, MD  brimonidine (ALPHAGAN) 0.2 % ophthalmic solution Place 1 drop into both eyes 2 (two) times daily. 10/06/11   [provider]  cholecalciferol (VITAMIN D3) 25 MCG (1000 UT) tablet Take 1,000 Units by mouth daily.    [provider]  clopidogrel (PLAVIX) 75 MG tablet Take 1 tablet (75 mg total) by mouth daily for 21  days. For 3 wk then stop 03/29/19 04/19/19  Antonieta Pert, MD  dorzolamide (TRUSOPT) 2 % ophthalmic solution Place 1 drop into both eyes 2 (two) times daily. 03/19/19   [provider]  ferrous sulfate 325 (65 FE) MG tablet Take 325 mg by mouth daily with breakfast.    [provider]  fluticasone furoate-vilanterol (BREO ELLIPTA) 100-25 MCG/INH AEPB Inhale 1 puff into the lungs daily. 03/27/19   Nita Sells, MD  latanoprost (XALATAN) 0.005 % ophthalmic solution Place 1 drop into both eyes at bedtime. 06/13/02    [provider]  lovastatin (MEVACOR) 20 MG tablet Take 20 mg by mouth at bedtime. 12/13/18   [provider]  Maltodextrin-Xanthan Gum ([place]) POWD Take 1 Container by mouth as needed. 03/26/19   Nita Sells, MD  metoprolol tartrate (LOPRESSOR) 25 MG tablet Take 25 mg by mouth daily. 12/31/18   [provider]  omeprazole (PRILOSEC) 40 MG capsule Take 40 mg by mouth daily. 02/18/19   [provider]  TRELEGY ELLIPTA 100-62.5-25 MCG/INH AEPB Take 1 puff by mouth daily. 02/01/19   [provider]  umeclidinium bromide (INCRUSE ELLIPTA) 62.5 MCG/INH AEPB Inhale 1 puff into the lungs daily. 03/27/19   Nita Sells, MD    Allergies    Patient has no known allergies.  Review of Systems   Review of Systems  Unable to perform ROS: Severe respiratory distress  Constitutional: Positive for fever.  Respiratory: Positive for cough, shortness of breath and wheezing.   Cardiovascular: Negative for chest pain.  Gastrointestinal: Negative for abdominal pain.    Physical Exam Updated Vital Signs BP (!) 104/53 (BP Location: Right Arm)   Pulse (!) 123   Temp (!) 101.7 F (38.7 C) (Axillary)   Resp (!) 33   SpO2 91%   Physical Exam Vitals and nursing note reviewed.  Constitutional:      General: He is in acute distress.     Appearance: He is obese. He is ill-appearing.  HENT:     Head: Normocephalic and atraumatic.     Nose: No rhinorrhea.     Mouth/Throat:     Mouth: Mucous membranes are moist.     Pharynx: No oropharyngeal exudate or posterior oropharyngeal erythema.  Eyes:     Conjunctiva/sclera: Conjunctivae normal.     Pupils: Pupils are equal, round, and reactive to light.  Cardiovascular:     Rate and Rhythm: Tachycardia present.     Heart sounds: No murmur.  Pulmonary:     Effort: Tachypnea, accessory muscle usage and respiratory distress present.     Breath sounds: No stridor. Wheezing present. No  rhonchi.  Abdominal:     General: There is no distension.     Tenderness: There is no abdominal tenderness.  Musculoskeletal:     Cervical back: No tenderness.  Neurological:     Mental Status: He is confused.     GCS: GCS eye subscore is 3. GCS verbal subscore is 1. GCS motor subscore is 6.     Comments: Patient is somnolent and in respiratory distress.  He is able to follow some commands and wiggle his toes and give thumbs up.  He did open his eyes to voice.  We will give him a chance to be not intubated at this time.     ED Results / Procedures / Treatments   Labs (all labs ordered are listed, but only abnormal results are displayed) Labs Reviewed  COMPREHENSIVE METABOLIC PANEL - Abnormal; Notable  for the following components:      Result Value   Potassium 5.3 (*)    Glucose, Bld 204 (*)    BUN 86 (*)    Creatinine, Ser 3.84 (*)    Total Protein 8.6 (*)    Albumin 2.9 (*)    AST 406 (*)    ALT 237 (*)    Alkaline Phosphatase 150 (*)    Total Bilirubin 1.3 (*)    GFR calc non Af Amer 14 (*)    GFR calc Af Amer 16 (*)    Anion gap 17 (*)    All other components within normal limits  LACTIC ACID, PLASMA - Abnormal; Notable for the following components:   Lactic Acid, Venous 3.6 (*)    All other components within normal limits  LACTIC ACID, PLASMA - Abnormal; Notable for the following components:   Lactic Acid, Venous 5.0 (*)    All other components within normal limits  CBC WITH DIFFERENTIAL/PLATELET - Abnormal; Notable for the following components:   MCHC 29.7 (*)    RDW 16.4 (*)    Abs Immature Granulocytes 0.08 (*)    All other components within normal limits  POC SARS CORONAVIRUS 2 AG -  ED - Abnormal; Notable for the following components:   SARS Coronavirus 2 Ag POSITIVE (*)    All other components within normal limits  CULTURE, BLOOD (ROUTINE X 2)  CULTURE, BLOOD (ROUTINE X 2)  URINE CULTURE  PROTIME-INR  URINALYSIS, ROUTINE W REFLEX MICROSCOPIC  BLOOD GAS,  ARTERIAL  POCT I-STAT 7, (LYTES, BLD GAS, ICA,H+H)    EKG EKG Interpretation  Date/Time:  Monday April 15 2019 14:02:34 EST Ventricular Rate:  124 PR Interval:    QRS Duration: 82 QT Interval:  273 QTC Calculation: 392 R Axis:   73 Text Interpretation: Sinus tachycardia Atrial premature complex When compared to prior, fastter rate. No STEMI Confirmed by Antony Blackbird [phone]) on 04/18/2019 2:15:06 PM   Radiology DG Chest Portable 1 View  Result Date: 04/13/2019 CLINICAL DATA:  Chronic respiratory failure with hypoxia. EXAM: PORTABLE CHEST 1 VIEW COMPARISON:  March 21, 2019. FINDINGS: Stable cardiomediastinal silhouette. No pneumothorax or pleural effusion is noted. Increased bibasilar opacities are noted concerning for multifocal pneumonia. Bony thorax is unremarkable. IMPRESSION: Increased bibasilar opacities are noted concerning for multifocal pneumonia. Continued radiographic follow-up is recommended to ensure resolution and rule out underlying neoplasm. Electronically Signed   By: Marijo Conception M.D.   On: 03/20/2019 15:08   US Abdomen Limited RUQ  Result Date: 03/28/2019 CLINICAL DATA:  Elevated liver enzymes EXAM: ULTRASOUND ABDOMEN LIMITED RIGHT UPPER QUADRANT COMPARISON:  None. FINDINGS: Gallbladder: Within the gallbladder, there are echogenic foci which move and shadow consistent with cholelithiasis. Largest gallstone measures 6 mm in length. No gallbladder wall thickening or pericholecystic fluid. No sonographic Murphy sign noted by sonographer. Common bile duct: Diameter: 3 mm. No intrahepatic or extrahepatic biliary duct dilatation. Liver: No focal lesion identified. Within normal limits in parenchymal echogenicity. Portal vein is patent on color Doppler imaging with normal direction of blood flow towards the liver. Other: None. IMPRESSION: Cholelithiasis. No gallbladder wall thickening or pericholecystic fluid. Study otherwise unremarkable. Electronically Signed   By:  Lowella Grip III M.D.   On: 04/07/2019 16:41    Procedures Procedures (including critical care time)  CRITICAL CARE Performed by: Gwenyth Allegra [place] Franson Total critical care time: 55 minutes Critical care time was exclusive of separately billable procedures and treating other patients. Critical care was  necessary to treat or prevent imminent or life-threatening deterioration. Critical care was time spent personally by me on the following activities: development of treatment plan with patient and/or surrogate as well as nursing, discussions with consultants, evaluation of patient's response to treatment, examination of patient, obtaining history from patient or surrogate, ordering and performing treatments and interventions, ordering and review of laboratory studies, ordering and review of radiographic studies, pulse oximetry and re-evaluation of patient's condition.  Phillip Owens was evaluated in Emergency Department on 03/19/2019 for the symptoms described in the history of present illness. He was evaluated in the context of the global COVID-19 pandemic, which necessitated consideration that the patient might be at risk for infection with the SARS-CoV-2 virus that causes COVID-19. Institutional protocols and algorithms that pertain to the evaluation of patients at risk for COVID-19 are in a state of rapid change based on information released by regulatory bodies including the CDC and federal and state organizations. These policies and algorithms were followed during the patient's care in the ED.   Medications Ordered in ED Medications  propofol (DIPRIVAN) 1000 MG/100ML infusion (has no administration in time range)  ipratropium-albuterol (DUONEB) 0.5-2.5 (3) MG/3ML nebulizer solution 3 mL (3 mLs Nebulization Not Given 03/25/2019 1528)  vancomycin (VANCOREADY) IVPB 2000 mg/400 mL (has no administration in time range)  ceFEPIme (MAXIPIME) 2 g in sodium chloride 0.9 % 100 mL IVPB (has no  administration in time range)  vancomycin variable dose per unstable renal function (pharmacist dosing) (has no administration in time range)  magnesium sulfate IVPB 2 g 50 mL (0 g Intravenous Stopped 04/08/2019 1540)  ceFEPIme (MAXIPIME) 2 g in sodium chloride 0.9 % 100 mL IVPB (0 g Intravenous Stopped 04/07/2019 1620)  metroNIDAZOLE (FLAGYL) IVPB 500 mg (500 mg Intravenous New Bag/Given 03/21/2019 1624)  sodium chloride 0.9 % bolus 500 mL (500 mLs Intravenous New Bag/Given 03/26/2019 1720)  sodium chloride 0.9 % bolus 1,000 mL (1,000 mLs Intravenous New Bag/Given 04/05/2019 1720)    ED Course  I have reviewed the triage vital signs and the nursing notes.  Pertinent labs & imaging results that were available during my care of the patient were reviewed by me and considered in my medical decision making (see chart for details).    MDM Rules/Calculators/A&P                      Deigo Kinsman is a 78 y.o. male with a past medical history significant for COPD on 4 L home nasal cannula oxygen, GERD, sleep apnea needing CPAP, hypertension, hyperlipidemia, recent strokes, and recent admission for respiratory failure with altered mental status and metabolic encephalopathy who presents with altered mental status and respiratory distress.  According to EMS report and nursing, patient had an episode this morning where he went unresponsive.  They are unsure of last known normal.  He was found to be Covid positive today when he was hypoxic and short of breath with his unresponsiveness.  He arrived on nonrebreather maintaining oxygen saturations in the 80s and low 90s.  He is extremely tachypneic on arrival as well as tachycardia and febrile.  Patient chart shows that he had similar metabolic encephalopathy in relation to hypoxia recently when he was found to have strokes.  On exam, patient is extremely tachypneic in the 40s.  He is tachycardic in the 120s.  He is febrile up to 101.7 measured axillary.  His blood  pressures are in the 120 range on arrival.  His lungs sound extremely  coarse and diminished with some wheezing.  Abdomen and chest are nontender.  Patient has surgical scars in his left shoulder.  When I yelled at patient, he was able to open his eyes to voice.  He also was able to follow some commands by wiggling toes and squeezing hands.  Patient is clearly in respiratory distress with hypoxia and tachypnea.  As he was diagnosed with COVID-19 infection today, I am concerned that this is playing a role.  I am also concerned that patients who are intubated with Covid especially with his comorbidities may have a very difficult time being extubated.  As he is following some commands, we will give him a period of time to see if BiPAP can help him as it appears to help him with his altered mental status during his recent admission.  Will treat for COPD exacerbation as well with magnesium, steroids, and breathing treatments.  If patient does not improve his respiratory rate and mental status, anticipate he will require intubation if he tires out or gets more hypoxic.  He will require admission after work-up.   With his recent stroke, will need CT head to make sure there are no hemorrhagic conversion or other intracranial abnormalities.  He does not appear to have seizure activity on our external exam and he is following some commands.   He was 19 infection.  Lactic acid was elevated.  Patient's vitals concerning for sepsis.  LFTs are elevated, will order right upper ultrasound.  Chest x-ray shows concern for multifocal pneumonia.  Given his sepsis vital signs, patient was made a code sepsis.  Patient received broad-spectrum antibiotics.  Patient's mental status somewhat improved on the BiPAP.  We will keep him on BiPAP.  ABG was reassuring initially.  The facility confirmed that they gave him Solu-Medrol before coming to the emergency department and this may seem to be helping.  They do say that this morning he  was more altered and confused and more somnolent with his respiratory symptoms.  Care transferred to Dr. Laverta Baltimore while awaiting for results of diagnostic work-up.  He will be admitted for further management of his sepsis and pneumonia in the setting of COVID-19 infection.   Final Clinical Impression(s) / ED Diagnoses Final diagnoses:  LFT elevation  Acute respiratory failure with hypoxia (HCC)  Sepsis, due to unspecified organism, unspecified whether acute organ dysfunction present ([place])  COVID-19    Clinical Impression: 1. Acute respiratory failure with hypoxia ([place])   2. LFT elevation   3. Sepsis, due to unspecified organism, unspecified whether acute organ dysfunction present ([place])   4. COVID-19     Disposition: Admit  This note was prepared with assistance of Dragon voice recognition software. Occasional wrong-word or sound-a-like substitutions may have occurred due to the inherent limitations of voice recognition software.     Timisha Mondry, Gwenyth Allegra, MD 04/05/2019 [phone]

## 2019-04-15 NOTE — ED Notes (Signed)
This RN was informed by Ronalee Belts, RN that Patient Placement was informed of pts status and will be placed at [facility]

## 2019-04-15 NOTE — ED Notes (Signed)
Provider stated to place order for infusion of IV fluids to decrease lactic acid level and pt will be reassessed at a later time.

## 2019-04-15 NOTE — Progress Notes (Signed)
Pharmacy Antibiotic Note  Phillip Owens is a 78 y.o. male admitted on 03/26/2019 with sepsis.  Pharmacy has been consulted for vancomycin and cefepime dosing. Pt is febrile and WBC is WNL. SCr is elevated well above baseline at 3.84. Lactic acid also elevated. Noted recent diagnosis of COVID-19.   Plan: Cefepime 2gm IV Q24H Vancomycin 2gm IV x 1 then trend Scr for further doses F/u renal fxn, C&S, clinical status and peak/trough at SS  Height: 6' (182.9 cm) Weight: 218 lb 6.4 oz (99.1 kg) IBW/kg (Calculated) : 77.6  Temp (24hrs), Avg:101.7 F (38.7 C), Min:101.7 F (38.7 C), Max:101.7 F (38.7 C)  Recent Labs  Lab 03/29/2019 1415  WBC 6.7  CREATININE 3.84*  LATICACIDVEN 3.6*    Estimated Creatinine Clearance: 19.6 mL/min (A) (by C-G formula based on SCr of 3.84 mg/dL (H)).    No Known Allergies  Antimicrobials this admission: Vanc 12/28>> Cefepime 12/28>> Flagyl x 1 12/28  Dose adjustments this admission: N/A  Microbiology results: Pending  Thank you for allowing pharmacy to be a part of this patient's care.  Markia Kyer, Rande Lawman 04/06/2019 3:15 PM

## 2019-04-15 NOTE — ED Notes (Signed)
Phillip Owens [phone] daughter call with an update

## 2019-04-15 NOTE — ED Notes (Signed)
ED Provider made aware of elevated Temp; Opted to use Ice packs to bring down fever. This RN Performed same.

## 2019-04-15 NOTE — ED Provider Notes (Signed)
Blood pressure 127/67, pulse (!) 122, temperature (!) 101.7 F (38.7 C), temperature source Axillary, resp. rate (!) 42, height 6' (1.829 m), weight 99.1 kg, SpO2 95 %.  Assuming care from Dr. Sherry Ruffing.  In short, Phillip Owens is a 78 y.o. male with a chief complaint of COVID+ and Respiratory Distress .  Refer to the original H&P for additional details.  The current plan of care is to f/u on abdominal US and admit. Patient is on BiPAP and has become more alert. COVID has come back positive, however. 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 Discussed with Dr. Vaughan Browner briefly regarding this and is in agreement.   04:37 PM  I spoke with the patient's wife and daughter by phone on a conference call.  I discussed his trouble breathing, Covid diagnosis, and overall poor prognosis with his underlying respiratory issues.  We discussed that he is improved slightly but he is near needing intubation.  Wife states that they have discussed this and that he would not want to be on a ventilator.  The daughter agrees with making the patient DNR/DNI.  They would like to continue with supportive care, antibiotics, and other interventions short of intubation.  I do plan to leave the patient on BiPAP (closed system) for now while in the ED. This will have to be removed for transport and ultimately for bed placement.   05:23 PM  Discussed patient's case with [facility], Dr. Eliseo Squires to request admission. Patient and family (if present) updated with plan. Care transferred to [facility] service.  I reviewed all nursing notes, vitals, pertinent old records, EKGs, labs, imaging (as available).  CRITICAL CARE Performed by: Margette Fast Total critical care time: 35 minutes Critical care time was exclusive of separately billable procedures and treating other patients. Critical care was necessary to treat or prevent imminent or life-threatening deterioration. Critical care was time spent personally  by me on the following activities: development of treatment plan with patient and/or surrogate as well as nursing, discussions with consultants, evaluation of patient's response to treatment, examination of patient, obtaining history from patient or surrogate, ordering and performing treatments and interventions, ordering and review of laboratory studies, ordering and review of radiographic studies, pulse oximetry and re-evaluation of patient's condition.  Phillip Quinton, MD Emergency Medicine     Phillip Owens, Phillip Olds, MD 03/30/[id number]

## 2019-04-15 NOTE — Progress Notes (Signed)
Notified bedside nurse of need to order lactic acid. 

## 2019-04-15 NOTE — ED Notes (Signed)
ED TO INPATIENT HANDOFF REPORT  ED Nurse Name and Phone #: Lunette Stands [phone]  S Name/Age/Gender Phillip Owens 78 y.o. male Room/Bed: 021C/021C  Code Status   Code Status: DNR  Home/SNF/Other Skilled nursing facility  Triage Complete: Triage complete  Chief Complaint COVID-19 [U07.1]  Triage Note Pt arrives to ED from [facility] with complaints of respiratory distress starting this afternoon. Per staff patient was in his normal state of heath this morning when he suddenly went unresponsive. Staff unable to give exact LKN. Patient was tested for COVID at facility today which came back positive. Patient O2 saturation on 15L NRB mask is at 88% on arrival to ED. Patient is belly breathing and unable to follow commands. EDP notified.     Allergies No Known Allergies  Level of Care/Admitting Diagnosis ED Disposition    ED Disposition Condition Comment   Admit  Hospital Area: [place] [[id number]]  Level of Care: Progressive [102]  Admit to Progressive based on following criteria: Other see comments  Comments: COVID 19 on servo  Covid Evaluation: Confirmed COVID Positive  Diagnosis: COVID-19 [[id number]]  Admitting Physician: Geradine Girt [id number]  Attending Physician: Geradine Girt [[id number]]  Estimated length of stay: past midnight tomorrow  Certification:: I certify this patient will need inpatient services for at least 2 midnights       B Medical/Surgery History Past Medical History:  Diagnosis Date  . Cancer ([place])   . Chronic respiratory failure with hypoxia, on home O2 therapy ([place])   . COPD (chronic obstructive pulmonary disease) ([place])   . Emphysema (subcutaneous) (surgical) resulting from a procedure   . Essential hypertension   . GERD (gastroesophageal reflux disease)   . Hyperglycemia   . Hyperlipidemia   . Multiple lung nodules on CT   . Obstructive sleep apnea on CPAP   . Oxygen dependent   . Renal insufficiency bilateral    . Sensorineural hearing loss    Past Surgical History:  Procedure Laterality Date  . LOOP RECORDER INSERTION N/A 03/26/2019   Procedure: LOOP RECORDER INSERTION;  Surgeon: Thompson Grayer, MD;  Location: [facility];  Service: Cardiovascular;  Laterality: N/A;     A IV Location/Drains/Wounds Patient Lines/Drains/Airways Status   Active Line/Drains/Airways    Name:   Placement date:   Placement time:   Site:   Days:   Peripheral IV 03/27/19 Right;Anterior;Upper Forearm   03/27/19    1506    Forearm   19   Peripheral IV 04/09/2019 Right Antecubital   03/20/2019    1414    Antecubital   less than 1   External Urinary Catheter   03/21/19    2356    --   25          Intake/Output Last 24 hours No intake or output data in the 24 hours ending 04/08/2019 2207  Labs/Imaging Results for orders placed or performed during the hospital encounter of 04/06/2019 (from the past 48 hour(s))  Comprehensive metabolic panel     Status: Abnormal   Collection Time: 04/17/2019  2:15 PM  Result Value Ref Range   Sodium 143 135 - 145 mmol/L   Potassium 5.3 (H) 3.5 - 5.1 mmol/L   Chloride 101 98 - 111 mmol/L   CO2 25 22 - 32 mmol/L   Glucose, Bld 204 (H) 70 - 99 mg/dL   BUN 86 (H) 8 - 23 mg/dL   Creatinine, Ser 3.84 (H) 0.61 - 1.24 mg/dL  Calcium 9.2 8.9 - 10.3 mg/dL   Total Protein 8.6 (H) 6.5 - 8.1 g/dL   Albumin 2.9 (L) 3.5 - 5.0 g/dL   AST 406 (H) 15 - 41 U/L   ALT 237 (H) 0 - 44 U/L   Alkaline Phosphatase 150 (H) 38 - 126 U/L   Total Bilirubin 1.3 (H) 0.3 - 1.2 mg/dL   GFR calc non Af Amer 14 (L) >60 mL/min   GFR calc Af Amer 16 (L) >60 mL/min   Anion gap 17 (H) 5 - 15    Comment: Performed at [facility] [address], [place], [place] [address]  Lactic acid, plasma     Status: Abnormal   Collection Time: 03/28/2019  2:15 PM  Result Value Ref Range   Lactic Acid, Venous 3.6 (HH) 0.5 - 1.9 mmol/L    Comment: CRITICAL RESULT CALLED TO, READ BACK BY AND VERIFIED WITH: MADISON  FOUNTAIN,RN AT 1500 03/29/2019 BY ZBEECH, Performed at [facility], [address] [place], [place] [address]   CBC with Differential     Status: Abnormal   Collection Time: 03/19/2019  2:15 PM  Result Value Ref Range   WBC 6.7 4.0 - 10.5 K/uL   RBC 5.44 4.22 - 5.81 MIL/uL   Hemoglobin 14.4 13.0 - 17.0 g/dL   HCT 48.5 39.0 - 52.0 %   MCV 89.2 80.0 - 100.0 fL   MCH 26.5 26.0 - 34.0 pg   MCHC 29.7 (L) 30.0 - 36.0 g/dL   RDW 16.4 (H) 11.5 - 15.5 %   Platelets 185 150 - 400 K/uL   nRBC 0.0 0.0 - 0.2 %   Neutrophils Relative % 83 %   Neutro Abs 5.6 1.7 - 7.7 K/uL   Lymphocytes Relative 13 %   Lymphs Abs 0.9 0.7 - 4.0 K/uL   Monocytes Relative 3 %   Monocytes Absolute 0.2 0.1 - 1.0 K/uL   Eosinophils Relative 0 %   Eosinophils Absolute 0.0 0.0 - 0.5 K/uL   Basophils Relative 0 %   Basophils Absolute 0.0 0.0 - 0.1 K/uL   Smear Review HIDE    Immature Granulocytes 1 %   Abs Immature Granulocytes 0.08 (H) 0.00 - 0.07 K/uL   Reactive, Benign Lymphocytes PRESENT     Comment: Performed at [facility], [place] [address], [place], [place] [address]  Protime-INR     Status: None   Collection Time: 03/25/2019  2:15 PM  Result Value Ref Range   Prothrombin Time 15.0 11.4 - 15.2 seconds   INR 1.2 0.8 - 1.2    Comment: (NOTE) INR goal varies based on device and disease states. Performed at [facility], [place] [address] [place], [place] [address]   ABO/Rh     Status: None   Collection Time: 03/23/2019  2:15 PM  Result Value Ref Range   ABO/RH(D)      AB POS Performed at [facility] [address] [place], [place] [address]   I-STAT 7, (LYTES, BLD GAS, ICA, H+H)     Status: None   Collection Time: 04/17/2019  3:18 PM  Result Value Ref Range   pH, Arterial 7.384 7.350 - 7.450   pCO2 arterial 43.2 32.0 - 48.0 mmHg   pO2, Arterial 103.0 83.0 - 108.0 mmHg   Bicarbonate 25.8 20.0 - 28.0 mmol/L   TCO2 27 22 - 32 mmol/L   O2 Saturation 98.0 %   Sodium 138 135 - 145  mmol/L   Potassium 4.9 3.5 - 5.1 mmol/L   Calcium, Ion 1.21 1.15 - 1.40 mmol/L   HCT 45.0 39.0 - 52.0 %   Hemoglobin 15.3 13.0 - 17.0 g/dL   Patient temperature HIDE    Sample type ARTERIAL   POC SARS Coronavirus 2 Ag-ED - Nasal Swab (BD Veritor Kit)     Status: Abnormal   Collection Time: 04/01/2019  3:29 PM  Result Value Ref Range   SARS Coronavirus 2 Ag POSITIVE (A) NEGATIVE    Comment: (NOTE) SARS-CoV-2 antigen PRESENT. Positive results indicate the presence of viral antigens, but clinical correlation with patient history and other diagnostic information is necessary to determine patient infection status.  Positive results do not rule out bacterial infection or co-infection  with other viruses. False positive results are rare but can occur, and confirmatory RT-PCR testing may be appropriate in some circumstances. The expected result is Negative. Fact Sheet for Patients: PodPark.tn Fact Sheet for Providers: GiftContent.is  This test is not yet approved or cleared by the [place] FDA and  has been authorized for detection and/or diagnosis of SARS-CoV-2 by FDA under an Emergency Use Authorization (EUA).  This EUA will remain in effect (meaning this test can be used) for the duration of  the COVID-19 declaration under Section 564(b)(1) of the Act, 21 U.S.C. section 360bbb-3(b)(1), unless the a uthorization is terminated or revoked sooner.   Lactic acid, plasma     Status: Abnormal   Collection Time: 03/29/2019  4:15 PM  Result Value Ref Range   Lactic Acid, Venous 5.0 (HH) 0.5 - 1.9 mmol/L    Comment: CRITICAL VALUE NOTED.  VALUE IS CONSISTENT WITH PREVIOUSLY REPORTED AND CALLED VALUE. Performed at [facility], [place] [address], [place], [place] [address]   APTT     Status: Abnormal   Collection Time: 04/09/2019  5:23 PM  Result Value Ref Range   aPTT 70 (H) 24 - 36 seconds    Comment:        IF BASELINE aPTT IS  ELEVATED, SUGGEST PATIENT RISK ASSESSMENT BE USED TO DETERMINE APPROPRIATE ANTICOAGULANT THERAPY. Performed at [facility], [place] [address], [place], [place] [address]   CBG monitoring, ED     Status: Abnormal   Collection Time: 03/26/2019  5:46 PM  Result Value Ref Range   Glucose-Capillary 165 (H) 70 - 99 mg/dL  Creatinine, serum     Status: Abnormal   Collection Time: 03/29/2019  6:30 PM  Result Value Ref Range   Creatinine, Ser 4.62 (H) 0.61 - 1.24 mg/dL   GFR calc non Af Amer 11 (L) >60 mL/min   GFR calc Af Amer 13 (L) >60 mL/min    Comment: Performed at [facility] [address], [place], [place] [address]  C-reactive protein     Status: Abnormal   Collection Time: 03/27/2019  6:30 PM  Result Value Ref Range   CRP 25.3 (H) <1.0 mg/dL    Comment: Performed at [facility] [address] [place], [place] [address]  D-dimer, quantitative (not at [facility])     Status: Abnormal   Collection Time: 04/06/2019  6:30 PM  Result Value Ref Range   D-Dimer, Quant 5.25 (H) 0.00 - 0.50 ug/mL-FEU    Comment: (NOTE) At the manufacturer cut-off of 0.50 ug/mL FEU, this assay has been documented to exclude PE with a sensitivity and negative predictive value of 97 to 99%.  At this time, this assay has  not been approved by the FDA to exclude DVT/VTE. Results should be correlated with clinical presentation. Performed at [facility], [place] [address], [place], [place] [address]   Lactate dehydrogenase     Status: Abnormal   Collection Time: 03/29/2019  6:30 PM  Result Value Ref Range   LDH 459 (H) 98 - 192 U/L    Comment: Performed at [facility] [address], [place], [place] [address]  Fibrinogen     Status: Abnormal   Collection Time: 04/02/2019  6:30 PM  Result Value Ref Range   Fibrinogen 764 (H) 210 - 475 mg/dL    Comment: Performed at [facility] [address], [place], [place] [address]  Lactic acid, plasma     Status: Abnormal   Collection Time:  04/18/2019  8:44 PM  Result Value Ref Range   Lactic Acid, Venous 6.1 (HH) 0.5 - 1.9 mmol/L    Comment: CRITICAL VALUE NOTED.  VALUE IS CONSISTENT WITH PREVIOUSLY REPORTED AND CALLED VALUE. Performed at [facility], [place] [address], [place], [place] [address]    DG Chest Portable 1 View  Result Date: 04/07/2019 CLINICAL DATA:  Chronic respiratory failure with hypoxia. EXAM: PORTABLE CHEST 1 VIEW COMPARISON:  March 21, 2019. FINDINGS: Stable cardiomediastinal silhouette. No pneumothorax or pleural effusion is noted. Increased bibasilar opacities are noted concerning for multifocal pneumonia. Bony thorax is unremarkable. IMPRESSION: Increased bibasilar opacities are noted concerning for multifocal pneumonia. Continued radiographic follow-up is recommended to ensure resolution and rule out underlying neoplasm. Electronically Signed   By: Marijo Conception M.D.   On: 04/09/2019 15:08   US Abdomen Limited RUQ  Result Date: 04/06/2019 CLINICAL DATA:  Elevated liver enzymes EXAM: ULTRASOUND ABDOMEN LIMITED RIGHT UPPER QUADRANT COMPARISON:  None. FINDINGS: Gallbladder: Within the gallbladder, there are echogenic foci which move and shadow consistent with cholelithiasis. Largest gallstone measures 6 mm in length. No gallbladder wall thickening or pericholecystic fluid. No sonographic Murphy sign noted by sonographer. Common bile duct: Diameter: 3 mm. No intrahepatic or extrahepatic biliary duct dilatation. Liver: No focal lesion identified. Within normal limits in parenchymal echogenicity. Portal vein is patent on color Doppler imaging with normal direction of blood flow towards the liver. Other: None. IMPRESSION: Cholelithiasis. No gallbladder wall thickening or pericholecystic fluid. Study otherwise unremarkable. Electronically Signed   By: Lowella Grip III M.D.   On: 04/14/2019 16:41    Pending Labs Unresulted Labs (From admission, onward)    Start     Ordered   05/11/2019 0500  Comprehensive  metabolic panel  Tomorrow morning,   R     03/22/2019 1742   05/11/19 0500  CBC with Differential/Platelet  Daily,   R     04/05/2019 1758   03/28/2019 2004  Lactic acid, plasma  Now then every 2 hours,   STAT     03/26/2019 2003   04/08/2019 1437  Urine culture  ONCE - STAT,   STAT     03/27/2019 1437   04/01/2019 1437  Blood gas, arterial  Once,   R     03/19/2019 1437   03/29/2019 1415  Culture, blood (Routine x 2)  BLOOD CULTURE X 2,   STAT     04/01/2019 1415   04/13/2019 1415  Urinalysis, Routine w reflex microscopic  ONCE - STAT,   STAT     03/24/2019 1415          Vitals/Pain Today's Vitals   04/01/2019 1836 04/18/2019  1915 03/20/2019 2034 03/31/2019 2038  BP: (!) 110/100 100/77    Pulse:    100  Resp: (!) 38 (!) 35  (!) 36  Temp:   100 F (37.8 C)   TempSrc:      SpO2:    100%  Weight:      Height:        Isolation Precautions Airborne and Contact precautions  Medications Medications  propofol (DIPRIVAN) 1000 MG/100ML infusion (  Not Given 04/02/2019 1853)  ipratropium-albuterol (DUONEB) 0.5-2.5 (3) MG/3ML nebulizer solution 3 mL (3 mLs Nebulization Not Given 03/28/2019 1528)  ceFEPIme (MAXIPIME) 2 g in sodium chloride 0.9 % 100 mL IVPB (has no administration in time range)  vancomycin variable dose per unstable renal function (pharmacist dosing) (has no administration in time range)  remdesivir 200 mg in sodium chloride 0.9% 250 mL IVPB (0 mg Intravenous Stopped 03/21/2019 1907)    Followed by  remdesivir 100 mg in sodium chloride 0.9 % 100 mL IVPB (has no administration in time range)  heparin injection 5,000 Units (has no administration in time range)  dexamethasone (DECADRON) injection 6 mg (6 mg Intravenous Given 04/11/2019 1827)  ondansetron (ZOFRAN) tablet 4 mg (has no administration in time range)    Or  ondansetron (ZOFRAN) injection 4 mg (has no administration in time range)  acetaminophen (TYLENOL) suppository 325 mg (has no administration in time range)  0.9 %  sodium chloride  infusion ( Intravenous New Bag/Given 03/29/2019 1832)  magnesium sulfate IVPB 2 g 50 mL (0 g Intravenous Stopped 04/04/2019 1540)  ceFEPIme (MAXIPIME) 2 g in sodium chloride 0.9 % 100 mL IVPB (0 g Intravenous Stopped 04/02/2019 1620)  metroNIDAZOLE (FLAGYL) IVPB 500 mg (0 mg Intravenous Stopped 03/26/2019 1751)  vancomycin (VANCOREADY) IVPB 2000 mg/400 mL (0 mg Intravenous Stopped 03/31/2019 2031)  sodium chloride 0.9 % bolus 500 mL (0 mLs Intravenous Stopped 04/06/2019 1907)  sodium chloride 0.9 % bolus 1,000 mL (0 mLs Intravenous Stopped 03/20/2019 1834)  0.9 %  sodium chloride infusion ( Intravenous New Bag/Given 04/13/2019 2034)    Mobility non-ambulatory High fall risk   Focused Assessments Pulmonary Assessment Handoff:  Lung sounds: Bilateral Breath Sounds: Clear, Diminished O2 Device: Bi-PAP O2 Flow Rate (L/min): 15 L/min      R Recommendations: See Admitting Provider Note  Report given to:   Additional Notes: N/A

## 2019-04-15 NOTE — H&P (Addendum)
History and Physical    Phillip Owens [id number] DOB: 17-Jun-1940 DOA: 04/04/2019  I have briefly reviewed the patient's prior medical records in [place]  PCP: Patient, No Pcp Per  Patient coming from: [facility]  Chief Complaint: unresponsive  HPI: Phillip Owens is a 78 y.o. male with medical history significant of recent stroke, COPD, sleep apnea, renal insufficiency.  This is his 3rd hospitalization in last month (02/2019- [facility], 03/2019- [facility])  He presents from [facility] with a positive COVID-19 test and reported unresponsiveness.  History is not clear as there are several versions from EMS as well as staff at [facility], but it appears that patient was found not responsive this a.m.  He was tested for COVID 19 at the facility prior and test came back positive today.  When patient arrived in the ER he was placed on 15 L nonrebreather mask and was 88% he was belly breathing and not following commands.  He was also tachypneic, febrile, and tachycardic.  As patient was in respiratory distress with hypoxia and tachypnea he was placed on BiPAP (Servo) by the ER physician..  Per the ER doctor while on the BiPAP his mentation has improved.  Patient's mental status baseline is unknown.  Patient was recently discharged December 11 after suffering from a stroke.    In the ER he was given Solu-Medrol, IV abx and remdesivir was ordered.  ER physician Dr. Laverta Baltimore had a conversation with family both wife and daughter.  They both state that he would not want to be on a ventilator and they would like him to be a DNR/DNI.  Currently they would like to continue with supportive care, antibiotics, and other medications.    Review of Systems: patient on bipap, unable to assess  Past Medical History:  Diagnosis Date  . Cancer ([place])   . Chronic respiratory failure with hypoxia, on home O2 therapy ([place])   . COPD (chronic obstructive pulmonary disease) ([place])   .  Emphysema (subcutaneous) (surgical) resulting from a procedure   . Essential hypertension   . GERD (gastroesophageal reflux disease)   . Hyperglycemia   . Hyperlipidemia   . Multiple lung nodules on CT   . Obstructive sleep apnea on CPAP   . Oxygen dependent   . Renal insufficiency bilateral   . Sensorineural hearing loss     Past Surgical History:  Procedure Laterality Date  . LOOP RECORDER INSERTION N/A 03/26/2019   Procedure: LOOP RECORDER INSERTION;  Surgeon: Thompson Grayer, MD;  Location: [facility];  Service: Cardiovascular;  Laterality: N/A;     reports that he has quit smoking. He uses smokeless tobacco. He reports previous alcohol use. He reports that he does not use drugs.  No Known Allergies  Family History  Problem Relation Age of Onset  . Hypertension Mother   . Hypertension Father     Prior to Admission medications   Medication Sig Start Date End Date Taking? Authorizing Provider  allopurinol (ZYLOPRIM) 300 MG tablet Take 300 mg by mouth daily. 02/05/19   [provider]  amLODipine (NORVASC) 10 MG tablet Take 10 mg by mouth daily. 02/05/19   [provider]  aspirin EC 81 MG tablet Take 81 mg by mouth daily. 01/21/08   [provider]  atorvastatin (LIPITOR) 40 MG tablet Take 1 tablet (40 mg total) by mouth daily at 6 PM. 03/26/19   Nita Sells, MD  brimonidine (ALPHAGAN) 0.2 % ophthalmic  solution Place 1 drop into both eyes 2 (two) times daily. 10/06/11   [provider]  cholecalciferol (VITAMIN D3) 25 MCG (1000 UT) tablet Take 1,000 Units by mouth daily.    [provider]  clopidogrel (PLAVIX) 75 MG tablet Take 1 tablet (75 mg total) by mouth daily for 21 days. For 3 wk then stop 03/29/19 04/19/19  Antonieta Pert, MD  dorzolamide (TRUSOPT) 2 % ophthalmic solution Place 1 drop into both eyes 2 (two) times daily. 03/19/19   [provider]  ferrous sulfate 325 (65 FE) MG tablet Take 325 mg by mouth daily  with breakfast.    [provider]  fluticasone furoate-vilanterol (BREO ELLIPTA) 100-25 MCG/INH AEPB Inhale 1 puff into the lungs daily. 03/27/19   Nita Sells, MD  latanoprost (XALATAN) 0.005 % ophthalmic solution Place 1 drop into both eyes at bedtime. 06/13/02   [provider]  lovastatin (MEVACOR) 20 MG tablet Take 20 mg by mouth at bedtime. 12/13/18   [provider]  Maltodextrin-Xanthan Gum ([place]) POWD Take 1 Container by mouth as needed. 03/26/19   Nita Sells, MD  metoprolol tartrate (LOPRESSOR) 25 MG tablet Take 25 mg by mouth daily. 12/31/18   [provider]  omeprazole (PRILOSEC) 40 MG capsule Take 40 mg by mouth daily. 02/18/19   [provider]  TRELEGY ELLIPTA 100-62.5-25 MCG/INH AEPB Take 1 puff by mouth daily. 02/01/19   [provider]  umeclidinium bromide (INCRUSE ELLIPTA) 62.5 MCG/INH AEPB Inhale 1 puff into the lungs daily. 03/27/19   Nita Sells, MD  zolpidem (AMBIEN) 10 MG tablet Take 10 mg by mouth at bedtime as needed for sleep.    [provider]    Physical Exam: Vitals:   03/23/2019 1500 03/26/2019 1515 04/05/2019 1530 03/30/2019 1626  BP:  (!) 131/99 (!) 109/52   Pulse:  (!) 120 (!) 117   Resp:   (!) 39   Temp:    (!) 102.5 F (39.2 C)  TempSrc:    Axillary  SpO2:  93% 95%   Weight: 99.1 kg     Height: 6' (1.829 m)         Constitutional: ill appearing, on BIPAP Neck: normal, supple, no masses, no thyromegaly Respiratory: diminished breath sounds,  On bipap, appears to still be working to breathe Cardiovascular: rrr Abdomen: no tenderness, no masses palpated. Bowel sounds positive.  Skin: warm dry in lower ext but cool upper ext (ice pack in place) Neurologic/psych:. awake but will not follow commands  Labs on Admission: I have personally reviewed following labs and imaging studies  CBC: Recent Labs  Lab 04/06/2019 1415 03/31/2019 1518  WBC 6.7  --     NEUTROABS 5.6  --   HGB 14.4 15.3  HCT 48.5 45.0  MCV 89.2  --   PLT 185  --    Basic Metabolic Panel: Recent Labs  Lab 03/22/2019 1415 03/21/2019 1518  NA 143 138  K 5.3* 4.9  CL 101  --   CO2 25  --   GLUCOSE 204*  --   BUN 86*  --   CREATININE 3.84*  --   CALCIUM 9.2  --    GFR: Estimated Creatinine Clearance: 19.6 mL/min (A) (by C-G formula based on SCr of 3.84 mg/dL (H)). Liver Function Tests: Recent Labs  Lab 04/17/2019 1415  AST 406*  ALT 237*  ALKPHOS 150*  BILITOT 1.3*  PROT 8.6*  ALBUMIN 2.9*   No results for input(s): LIPASE,  AMYLASE in the last 168 hours. No results for input(s): AMMONIA in the last 168 hours. Coagulation Profile: Recent Labs  Lab 04/14/2019 1415  INR 1.2   Cardiac Enzymes: No results for input(s): CKTOTAL, CKMB, CKMBINDEX, TROPONINI in the last 168 hours. BNP (last 3 results) No results for input(s): PROBNP in the last 8760 hours. HbA1C: No results for input(s): HGBA1C in the last 72 hours. CBG: Recent Labs  Lab 03/31/2019 1746  GLUCAP 165*   Lipid Profile: No results for input(s): CHOL, HDL, LDLCALC, TRIG, CHOLHDL, LDLDIRECT in the last 72 hours. Thyroid Function Tests: No results for input(s): TSH, T4TOTAL, FREET4, T3FREE, THYROIDAB in the last 72 hours. Anemia Panel: No results for input(s): VITAMINB12, FOLATE, FERRITIN, TIBC, IRON, RETICCTPCT in the last 72 hours. Urine analysis:    Component Value Date/Time   COLORURINE YELLOW 03/21/2019 1703   APPEARANCEUR HAZY (A) 03/21/2019 1703   LABSPEC >1.046 (H) 03/21/2019 1703   PHURINE 5.0 03/21/2019 1703   GLUCOSEU 50 (A) 03/21/2019 1703   HGBUR SMALL (A) 03/21/2019 1703   BILIRUBINUR NEGATIVE 03/21/2019 1703   KETONESUR NEGATIVE 03/21/2019 1703   PROTEINUR 30 (A) 03/21/2019 1703   NITRITE NEGATIVE 03/21/2019 1703   LEUKOCYTESUR NEGATIVE 03/21/2019 1703     Radiological Exams on Admission: DG Chest Portable 1 View  Result Date: 04/10/2019 CLINICAL DATA:  Chronic  respiratory failure with hypoxia. EXAM: PORTABLE CHEST 1 VIEW COMPARISON:  March 21, 2019. FINDINGS: Stable cardiomediastinal silhouette. No pneumothorax or pleural effusion is noted. Increased bibasilar opacities are noted concerning for multifocal pneumonia. Bony thorax is unremarkable. IMPRESSION: Increased bibasilar opacities are noted concerning for multifocal pneumonia. Continued radiographic follow-up is recommended to ensure resolution and rule out underlying neoplasm. Electronically Signed   By: Marijo Conception M.D.   On: 04/03/2019 15:08   US Abdomen Limited RUQ  Result Date: 04/11/2019 CLINICAL DATA:  Elevated liver enzymes EXAM: ULTRASOUND ABDOMEN LIMITED RIGHT UPPER QUADRANT COMPARISON:  None. FINDINGS: Gallbladder: Within the gallbladder, there are echogenic foci which move and shadow consistent with cholelithiasis. Largest gallstone measures 6 mm in length. No gallbladder wall thickening or pericholecystic fluid. No sonographic Murphy sign noted by sonographer. Common bile duct: Diameter: 3 mm. No intrahepatic or extrahepatic biliary duct dilatation. Liver: No focal lesion identified. Within normal limits in parenchymal echogenicity. Portal vein is patent on color Doppler imaging with normal direction of blood flow towards the liver. Other: None. IMPRESSION: Cholelithiasis. No gallbladder wall thickening or pericholecystic fluid. Study otherwise unremarkable. Electronically Signed   By: Lowella Grip III M.D.   On: 04/03/2019 16:41       Assessment/Plan Active Problems:   COPD (chronic obstructive pulmonary disease) (HCC)   OSA (obstructive sleep apnea)   Cerebral embolism with cerebral infarction   COVID-19 virus infection   DNR (do not resuscitate)   COVID-19    Acute on chronic respiratory failure due to COVID-19 -COVID 19 order set utilized -placed on airborne precautions -will need to eventually come off Bipap (my fear is he will not do well) -steroids ordered  IV -consult placed for remdesivir -tylenol ordered for fever -overall prognosis poor -will do gentle IVF as I do not want to volume overload (recommended to keep COVID 19 patients on the dry side)  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  He has a poor prognosis and I am doubtful he will survive this hospitalization.     DVT prophylaxis: heparin Code Status: dnr  Family Communication: called wife was not able to reach Disposition Plan: suspect patient will not survive hospitalization Consults called:     Admission status:     At the time of admission, it appears that the appropriate admission status for this patient is INPATIENT. This is judged to be reasonable and necessary in order to provide the required high service intensity to ensure the patient's safety given the presenting symptoms, physical exam findings, and initial radiographic and laboratory data in the context of their chronic comorbidities. Current circumstances are the patient will most likely not survive this hospitalization, and it is felt to place patient at high risk for further clinical deterioration threatening life, limb, or organ. Moreover, it is my clinical judgment that the patient will require inpatient hospital care spanning beyond 2 midnights from the point of admission and that early discharge would result in unnecessary risk of decompensation and readmission or threat to life, limb or bodily function.   Geradine Girt [facility]   How to contact the [facility] Attending or  Consulting provider [place] or covering provider during after hours [place], for this patient?  1. Check the care team in [facility] and look for a) attending/consulting [facility] provider listed and b) the [place] team listed 2. Log into [email or web address] and use [place]'s universal password to access. If you do not have the password, please contact the hospital operator. 3. Locate the [facility] provider you are looking for under [facility] and page to a number that you can be directly reached. 4. If you still have difficulty reaching the provider, please page the [facility] (Director on Call) for the Hospitalists listed on amion for assistance.  04/03/2019, 5:58 PM

## 2019-04-15 NOTE — ED Notes (Signed)
This RN and Ronalee Belts, RN from 5W have paged the provider to determine if this pt still needs BiPAP. Awaiting further orders.

## 2019-04-15 NOTE — ED Triage Notes (Signed)
Pt arrives to ED from [facility] with complaints of respiratory distress starting this afternoon. Per staff patient was in his normal state of heath this morning when he suddenly went unresponsive. Staff unable to give exact LKN. Patient was tested for COVID at facility today which came back positive. Patient O2 saturation on 15L NRB mask is at 88% on arrival to ED. Patient is belly breathing and unable to follow commands. EDP notified.

## 2019-04-15 NOTE — ED Notes (Signed)
This RN performed a bladder scan and it displayed less than 15 ml.

## 2019-04-15 NOTE — Progress Notes (Signed)
Notified provider of need to order repeat lactic acid. ° °

## 2019-04-15 NOTE — ED Notes (Addendum)
Provider made aware of increased Lactic Acid. Awaiting orders. Also made aware of no urine output thus far.

## 2019-04-16 LAB — LACTIC ACID, PLASMA: Lactic Acid, Venous: 6 mmol/L (ref 0.5–1.9)

## 2019-04-19 NOTE — ED Notes (Signed)
Pt having agonal breathing

## 2019-04-19 NOTE — ED Notes (Addendum)
No pulses heard with doppler. Pt in PEA.

## 2019-04-19 NOTE — ED Notes (Signed)
Dr. Dayna Barker at bedside declaring time of death at [phone]

## 2019-04-19 NOTE — ED Notes (Addendum)
RN walked into room after receiving report. Pt has no respiratory effort, Pt has weak radials, pt not responsive to and stimuli. RN cannot hear manual pressure. Paging MD.

## 2019-04-19 NOTE — ED Notes (Signed)
Pt's pupils are nonreactive. MD called RN and states that she is not over this pt and MD, Hal Hope is in emergency. RN is getting EDP.

## 2019-04-19 NOTE — Progress Notes (Signed)
04:08am:  Spoke with spouse Wyeth Goudreau to advise of cardiac arrest and that Mr Dame had passed.  She reported she was with her daughter and had no further questions at this time.

## 2019-04-19 NOTE — ED Notes (Signed)
RN calling [facility]. CDS states that pt is full release and they will not be pursuing donation services with pt.   Referral number received by Leonor Liv [phone]

## 2019-04-19 NOTE — ED Notes (Addendum)
Found patient's cell phone on back of stretcher, called wife Bonnita Nasuti) and left message for her to call me. Left direct phone number so she can easily reach me.

## 2019-04-19 NOTE — ED Notes (Signed)
This RN informed attending provider of patients status.  In regards to Lactic Acid: Pt will remain on 100 ml/hr of NaCl due to low GFR and decreased urine output.   In regards to respiratory status, pt will remain on BiPAP until pt can be weaned off at later time if possible.   No further orders at this time other than observation, monitoring, and respiratory assessments.

## 2019-04-19 NOTE — Progress Notes (Signed)
11pm: Spoke with Dr Hal Hope regarding patient's BiPap needs.  Floor is unable to take pt in BiPap.  [facility] will accept pt but [facility] cannot transfer on BiPap.  As he had previously failed NRM, Dr Hal Hope decided to keep pt in ER for monitoring and possibly weaning off BiPap if possible.

## 2019-04-19 NOTE — ED Notes (Signed)
Paged APP Sharlet Salina to RN [facility]

## 2019-04-19 NOTE — ED Notes (Signed)
MD calling family

## 2019-04-19 DEATH — deceased

## 2019-04-20 LAB — CULTURE, BLOOD (ROUTINE X 2)
Culture: NO GROWTH
Special Requests: ADEQUATE

## 2020-08-23 IMAGING — DX DG CHEST 1V PORT
1 series · 1 of 1 positions shown · non-contrast
Comparison: March 21, 2019.

CLINICAL DATA: Chronic respiratory failure with hypoxia.

EXAM:
PORTABLE CHEST 1 VIEW

[chest]
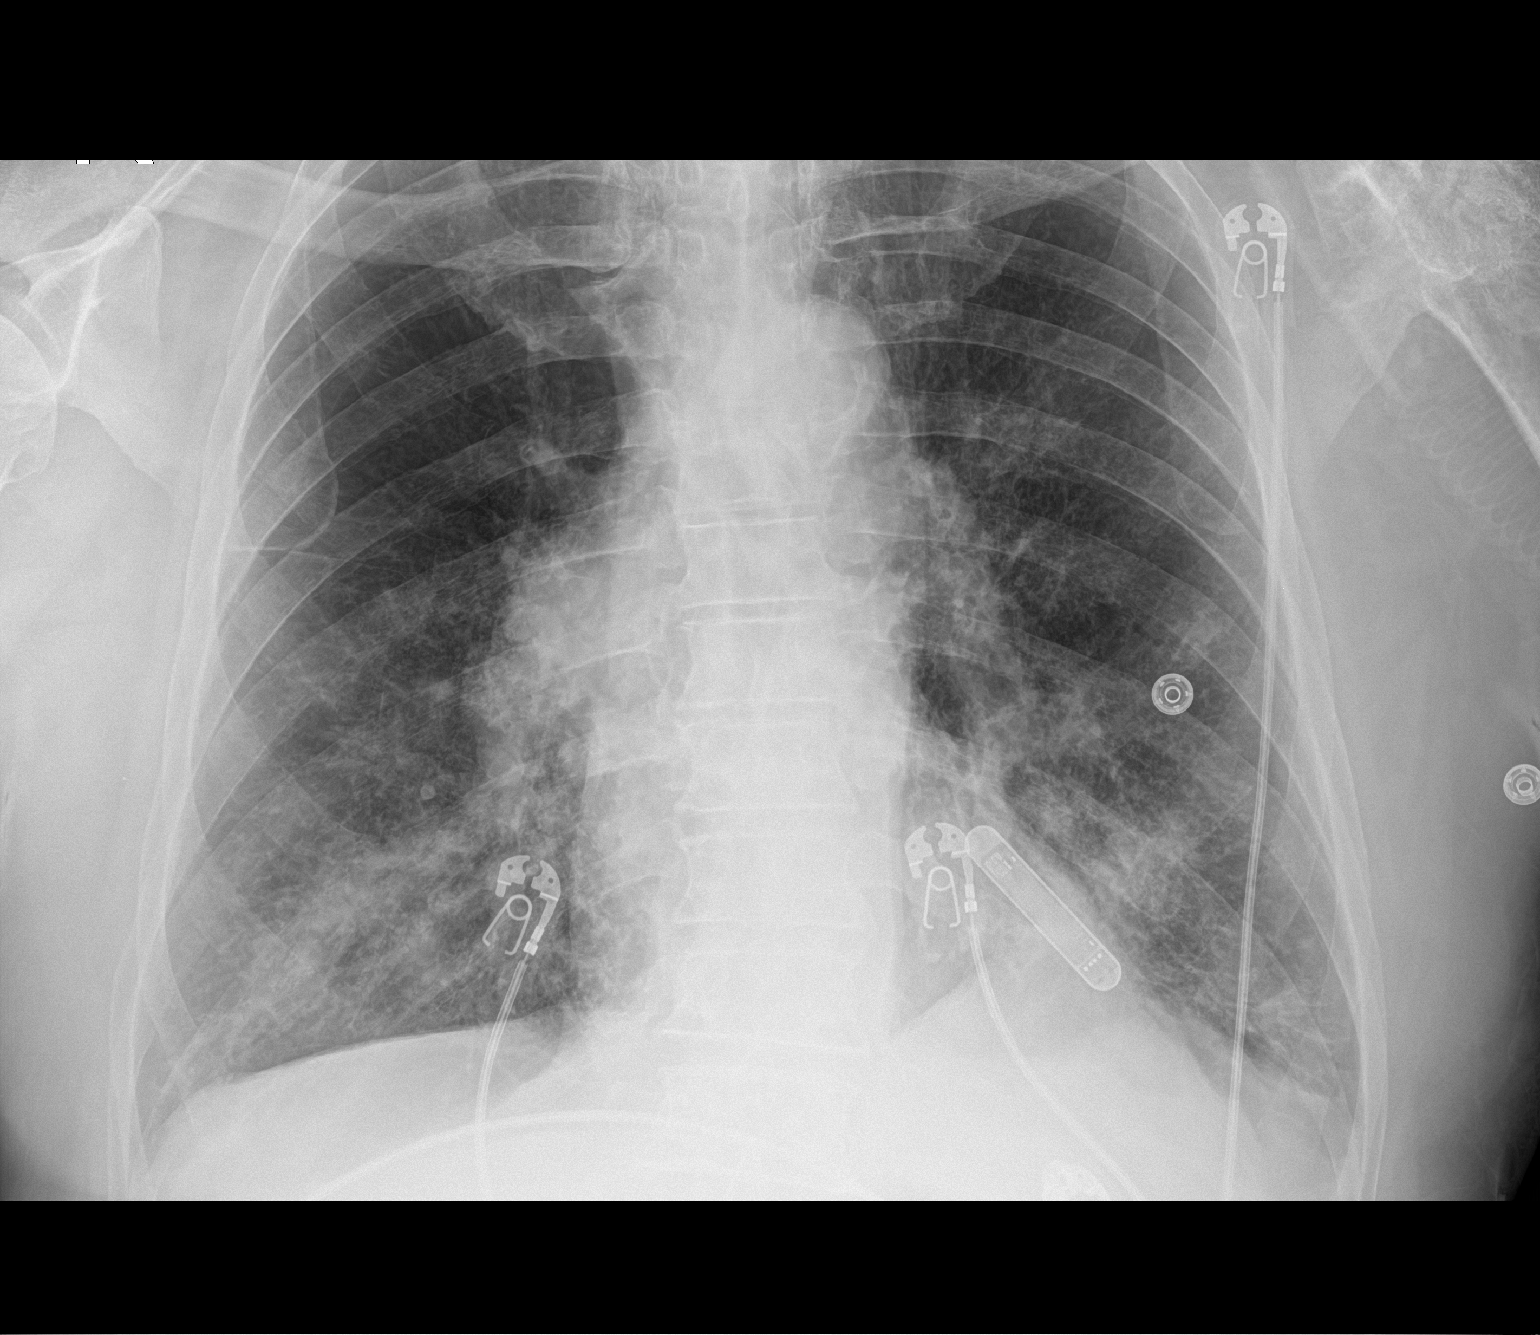

[1 of 1 positions shown; findings below may reference images not displayed]

FINDINGS: Stable cardiomediastinal silhouette. No pneumothorax or pleural
effusion is noted. Increased bibasilar opacities are noted
concerning for multifocal pneumonia. Bony thorax is unremarkable.
IMPRESSION: Increased bibasilar opacities are noted concerning for multifocal
pneumonia. Continued radiographic follow-up is recommended to ensure
resolution and rule out underlying neoplasm.
